# Patient Record
Sex: Male | Born: 1935 | Race: White | Hispanic: No | Marital: Married | Smoking: Former smoker
Health system: Southern US, Community
[De-identification: ages and names within clinical notes are randomized; demographics above are authoritative.]

## PROBLEM LIST (undated history)

## (undated) DIAGNOSIS — R159 Full incontinence of feces: Secondary | ICD-10-CM

## (undated) DIAGNOSIS — C4440 Unspecified malignant neoplasm of skin of scalp and neck: Secondary | ICD-10-CM

## (undated) DIAGNOSIS — M1990 Unspecified osteoarthritis, unspecified site: Secondary | ICD-10-CM

## (undated) DIAGNOSIS — Z8672 Personal history of thrombophlebitis: Secondary | ICD-10-CM

## (undated) DIAGNOSIS — F419 Anxiety disorder, unspecified: Secondary | ICD-10-CM

## (undated) DIAGNOSIS — G4730 Sleep apnea, unspecified: Secondary | ICD-10-CM

## (undated) DIAGNOSIS — I10 Essential (primary) hypertension: Secondary | ICD-10-CM

## (undated) DIAGNOSIS — F329 Major depressive disorder, single episode, unspecified: Secondary | ICD-10-CM

## (undated) DIAGNOSIS — F32A Depression, unspecified: Secondary | ICD-10-CM

## (undated) DIAGNOSIS — E785 Hyperlipidemia, unspecified: Secondary | ICD-10-CM

## (undated) DIAGNOSIS — C801 Malignant (primary) neoplasm, unspecified: Secondary | ICD-10-CM

## (undated) DIAGNOSIS — K219 Gastro-esophageal reflux disease without esophagitis: Secondary | ICD-10-CM

## (undated) DIAGNOSIS — E119 Type 2 diabetes mellitus without complications: Secondary | ICD-10-CM

## (undated) DIAGNOSIS — Z923 Personal history of irradiation: Secondary | ICD-10-CM

## (undated) DIAGNOSIS — N2889 Other specified disorders of kidney and ureter: Secondary | ICD-10-CM

## (undated) DIAGNOSIS — Z889 Allergy status to unspecified drugs, medicaments and biological substances status: Secondary | ICD-10-CM

## (undated) DIAGNOSIS — R350 Frequency of micturition: Secondary | ICD-10-CM

## (undated) DIAGNOSIS — Z9189 Other specified personal risk factors, not elsewhere classified: Secondary | ICD-10-CM

## (undated) DIAGNOSIS — C4490 Unspecified malignant neoplasm of skin, unspecified: Secondary | ICD-10-CM

## (undated) DIAGNOSIS — I6529 Occlusion and stenosis of unspecified carotid artery: Secondary | ICD-10-CM

## (undated) DIAGNOSIS — F0390 Unspecified dementia without behavioral disturbance: Secondary | ICD-10-CM

## (undated) HISTORY — PX: COLON SURGERY: SHX602

## (undated) HISTORY — DX: Malignant (primary) neoplasm, unspecified: C80.1

## (undated) HISTORY — DX: Hyperlipidemia, unspecified: E78.5

## (undated) HISTORY — PX: PROSTATECTOMY: SHX69

## (undated) HISTORY — PX: LUMBAR LAMINECTOMY: SHX95

## (undated) HISTORY — DX: Anxiety disorder, unspecified: F41.9

## (undated) HISTORY — DX: Type 2 diabetes mellitus without complications: E11.9

## (undated) HISTORY — PX: APPENDECTOMY: SHX54

## (undated) HISTORY — DX: Occlusion and stenosis of unspecified carotid artery: I65.29

## (undated) HISTORY — PX: OTHER SURGICAL HISTORY: SHX169

## (undated) HISTORY — PX: HERNIA REPAIR: SHX51

## (undated) HISTORY — PX: SKIN CANCER EXCISION: SHX779

## (undated) HISTORY — DX: Unspecified dementia without behavioral disturbance: F03.90

## (undated) HISTORY — DX: Essential (primary) hypertension: I10

## (undated) HISTORY — PX: CATARACT EXTRACTION: SUR2

## (undated) HISTORY — PX: TONSILLECTOMY: SUR1361

---

## 1998-07-25 ENCOUNTER — Encounter: Payer: Self-pay | Admitting: Oncology

## 1998-07-25 ENCOUNTER — Ambulatory Visit (HOSPITAL_COMMUNITY): Admission: RE | Admit: 1998-07-25 | Discharge: 1998-07-25 | Payer: Self-pay | Admitting: Oncology

## 1999-10-09 ENCOUNTER — Ambulatory Visit (HOSPITAL_COMMUNITY): Admission: RE | Admit: 1999-10-09 | Discharge: 1999-10-09 | Payer: Self-pay | Admitting: Oncology

## 1999-10-09 ENCOUNTER — Encounter: Payer: Self-pay | Admitting: Oncology

## 2000-01-02 ENCOUNTER — Ambulatory Visit (HOSPITAL_COMMUNITY): Admission: RE | Admit: 2000-01-02 | Discharge: 2000-01-02 | Payer: Self-pay | Admitting: Gastroenterology

## 2000-01-02 ENCOUNTER — Encounter (INDEPENDENT_AMBULATORY_CARE_PROVIDER_SITE_OTHER): Payer: Self-pay | Admitting: Specialist

## 2000-10-20 ENCOUNTER — Ambulatory Visit (HOSPITAL_COMMUNITY): Admission: RE | Admit: 2000-10-20 | Discharge: 2000-10-20 | Payer: Self-pay | Admitting: Gastroenterology

## 2000-10-20 ENCOUNTER — Encounter: Payer: Self-pay | Admitting: Gastroenterology

## 2003-01-24 ENCOUNTER — Inpatient Hospital Stay (HOSPITAL_COMMUNITY): Admission: AD | Admit: 2003-01-24 | Discharge: 2003-01-27 | Payer: Self-pay | Admitting: Endocrinology

## 2003-05-22 ENCOUNTER — Encounter: Payer: Self-pay | Admitting: Endocrinology

## 2003-05-22 ENCOUNTER — Ambulatory Visit (HOSPITAL_COMMUNITY): Admission: RE | Admit: 2003-05-22 | Discharge: 2003-05-22 | Payer: Self-pay | Admitting: Endocrinology

## 2004-05-22 ENCOUNTER — Ambulatory Visit (HOSPITAL_COMMUNITY): Admission: RE | Admit: 2004-05-22 | Discharge: 2004-05-22 | Payer: Self-pay | Admitting: Urology

## 2004-06-19 ENCOUNTER — Ambulatory Visit: Admission: RE | Admit: 2004-06-19 | Discharge: 2004-07-05 | Payer: Self-pay | Admitting: Radiation Oncology

## 2004-08-21 ENCOUNTER — Ambulatory Visit: Admission: RE | Admit: 2004-08-21 | Discharge: 2004-11-12 | Payer: Self-pay | Admitting: Radiation Oncology

## 2004-11-29 ENCOUNTER — Ambulatory Visit: Admission: RE | Admit: 2004-11-29 | Discharge: 2004-11-29 | Payer: Self-pay | Admitting: Radiation Oncology

## 2005-02-04 ENCOUNTER — Encounter: Admission: RE | Admit: 2005-02-04 | Discharge: 2005-02-04 | Payer: Self-pay | Admitting: Internal Medicine

## 2005-08-27 ENCOUNTER — Ambulatory Visit (HOSPITAL_COMMUNITY): Admission: RE | Admit: 2005-08-27 | Discharge: 2005-08-27 | Payer: Self-pay | Admitting: Internal Medicine

## 2006-11-18 ENCOUNTER — Encounter: Admission: RE | Admit: 2006-11-18 | Discharge: 2006-11-18 | Payer: Self-pay | Admitting: Neurosurgery

## 2007-04-30 ENCOUNTER — Ambulatory Visit: Payer: Self-pay | Admitting: *Deleted

## 2007-05-19 ENCOUNTER — Ambulatory Visit: Payer: Self-pay | Admitting: Vascular Surgery

## 2008-05-03 ENCOUNTER — Ambulatory Visit: Payer: Self-pay | Admitting: Vascular Surgery

## 2008-06-21 ENCOUNTER — Encounter: Admission: RE | Admit: 2008-06-21 | Discharge: 2008-06-21 | Payer: Self-pay | Admitting: Internal Medicine

## 2008-10-26 ENCOUNTER — Emergency Department (HOSPITAL_COMMUNITY): Admission: EM | Admit: 2008-10-26 | Discharge: 2008-10-26 | Payer: Self-pay | Admitting: Emergency Medicine

## 2008-11-08 ENCOUNTER — Ambulatory Visit: Payer: Self-pay | Admitting: Vascular Surgery

## 2009-05-08 ENCOUNTER — Ambulatory Visit: Payer: Self-pay | Admitting: Vascular Surgery

## 2009-11-07 ENCOUNTER — Ambulatory Visit: Payer: Self-pay | Admitting: Vascular Surgery

## 2010-07-10 ENCOUNTER — Ambulatory Visit: Payer: Self-pay | Admitting: Vascular Surgery

## 2011-02-05 NOTE — Procedures (Signed)
CAROTID DUPLEX EXAM   INDICATION:  Followup carotid artery disease.   HISTORY:  Diabetes:  no  Cardiac:  no  Hypertension:  yes  Smoking:  Previous  Previous Surgery:  no  CV History:  Asymptomatic  Amaurosis Fugax No, Paresthesias No, Hemiparesis No                                       RIGHT             LEFT  Brachial systolic pressure:         130               128  Brachial Doppler waveforms:         wnl               wnl  Vertebral direction of flow:        Antegrade         Antegrade  DUPLEX VELOCITIES (cm/sec)  CCA peak systolic                   116               98  ECA peak systolic                   150               136  ICA peak systolic                   262               106  ICA end diastolic                   76                35  PLAQUE MORPHOLOGY:                  heterogenous      heterogenous  PLAQUE AMOUNT:                      Moderate/ severe  Mild  PLAQUE LOCATION:                    ICA / ECA / bifurcation             ICA / ECA / bifurcation   IMPRESSION:  1. Right internal carotid artery shows evidence of 60% to 79%      stenosis.  2. Left internal carotid artery shows evidence of 20% to 39% stenosis.  3. No significant changes from previous study.   ___________________________________________  Quita Skye Hart [place], M.D.   AS/MEDQ  D:  11/07/2009  T:  11/07/2009  Job:  [id number]

## 2011-02-05 NOTE — Procedures (Signed)
CAROTID DUPLEX EXAM   INDICATION:  Follow up carotid artery disease.   HISTORY:  Diabetes:  No.  Cardiac:  No.  Hypertension:  Yes.  Smoking:  Previous.  Previous Surgery:  No.  CV History:  Asymptomatic.  Amaurosis Fugax , Paresthesias , Hemiparesis                                       RIGHT             LEFT  Brachial systolic pressure:         127               133  Brachial Doppler waveforms:         Within normal limits                Within normal limits  Vertebral direction of flow:        Antegrade         Antegrade  DUPLEX VELOCITIES (cm/sec)  CCA peak systolic                   94                74  ECA peak systolic                   109               134  ICA peak systolic                   227               83  ICA end diastolic                   65                32  PLAQUE MORPHOLOGY:                  Heterogenous      Heterogenous  PLAQUE AMOUNT:                      Moderate          Mild  PLAQUE LOCATION:                    ICA               ICA, bifurcation,  ECA   IMPRESSION:  1. Right internal carotid artery velocities suggest 60% to 79%      stenosis.  2. Left internal carotid artery velocities suggest 1% to 39% stenosis.  3. No significant changes from previous study.   ___________________________________________  Melvin Kent [place], M.D.   EM/MEDQ  D:  07/10/2010  T:  07/10/2010  Job:  [id number]

## 2011-02-05 NOTE — Procedures (Signed)
CAROTID DUPLEX EXAM   INDICATION:  Followup evaluation of known carotid artery disease.   HISTORY:  Diabetes:  No.  Cardiac:  No.  Hypertension:  Yes.  Smoking:  One and a half packs per day for 35 years, but does not  currently smoke.  Previous Surgery:  No.  CV History:  Patient reports no cerebrovascular symptoms at this time.  Amaurosis Fugax no, Paresthesias no, Hemiparesis no.                                       RIGHT             LEFT  Brachial systolic pressure:         146               146  Brachial Doppler waveforms:         Triphasic         Triphasic  Vertebral direction of flow:        Antegrade         Antegrade  DUPLEX VELOCITIES (cm/sec)  CCA peak systolic                   99                79  ECA peak systolic                   89                120  ICA peak systolic                   237               76  ICA end diastolic                   68                24  PLAQUE MORPHOLOGY:                  Calcified         Mixed  PLAQUE AMOUNT:                      Moderate-to-severe                  Mild  PLAQUE LOCATION:                    Proximal ICA      Proximal ICA   IMPRESSION:  60-79% right internal carotid artery stenosis.   20-39% left internal carotid artery stenosis.   No significant change from previous study performed Feb 11, 2006.   ___________________________________________  Quita Skye. Hart [place], M.D.   MC/MEDQ  D:  05/19/2007  T:  05/20/2007  Job:  [id number]

## 2011-02-05 NOTE — Procedures (Signed)
CAROTID DUPLEX EXAM   INDICATION:  Follow-up of known carotid artery disease.   HISTORY:  Diabetes:  No.  Cardiac:  No.  Hypertension:  Yes.  Smoking:  Quit.  Previous Surgery:  No.  CV History:  No.  Amaurosis Fugax No, Paresthesias No, Hemiparesis No.                                       RIGHT             LEFT  Brachial systolic pressure:         124               130  Brachial Doppler waveforms:         Biphasic          Biphasic  Vertebral direction of flow:        Antegrade         Antegrade  DUPLEX VELOCITIES (cm/sec)  CCA peak systolic                   140               81  ECA peak systolic                   156               119  ICA peak systolic                   235               108 distal  ICA end diastolic                   71                36  PLAQUE MORPHOLOGY:                  Heterogeneous     Heterogeneous  PLAQUE AMOUNT:                      Moderate to severe                  Mild  PLAQUE LOCATION:                    BIF/ICA/ECA       BIF/ICA   IMPRESSION:  1. A 60% to 79% stenosis of the right internal carotid artery.  2. A 20% to 39% stenosis of left internal carotid artery.    ___________________________________________  Quita Skye Hart [place], M.D.   AC/MEDQ  D:  11/08/2008  T:  11/08/2008  Job:  [id number]

## 2011-02-05 NOTE — Consult Note (Signed)
NEW PATIENT CONSULTATION   Melvin Kent, Melvin Kent  DOB:  08/25/1930                                       07/10/2010  CHART#:[id number]   The patient is a 77 -year-old male patient with known carotid occlusive  disease.  He denies any hemispheric or nonhemispheric TIAs, amaurosis  fugax, diplopia, blurred vision, syncope or previous stroke.  He has  been known in the past to have an approximately 60% right internal  carotid stenosis.   CHRONIC MEDICAL PROBLEMS:  1. Hypertension.  2. Hyperlipidemia.  3. Allergies including ragweed.  4. Negative for coronary artery disease, diabetes, COPD or stroke.   SOCIAL HISTORY:  He is married, has 3 children.  He is retired Cabin crew and  retired Airline pilot.  He does not use tobacco or alcohol.  He has  not smoked since 1975.   FAMILY HISTORY:  Positive for coronary artery disease in his brother and  multiple siblings.  Negative for diabetes and stroke.   REVIEW OF SYSTEMS:  Denies any chest pain, dyspnea on exertion, PND,  orthopnea, claudication, no productive cough, bronchitis.  There is  wheeze only when around ragweed.  All other systems in review of systems  are negative.   PHYSICAL EXAMINATION:  Vital signs:  Blood pressure 128/78, heart rate  60, respirations 14.  General:  Well-developed, well-nourished male in  no apparent stress, alert and oriented x 3.  HEENT:  Exam normal.  EOMs  intact.  Lungs:  Clear to auscultation.  No rhonchi or wheezing.  Cardiovascular:  Regular rhythm.  No murmurs.  Carotid pulses 3+, no  bruits.  Abdomen:  Obese.  No palpable masses.  Musculoskeletal:  Exam  is free of major deformities.  Neurologic:  Exam is normal.  SKIN:  Free  of rashes.  Lower extremity exam:  Reveals 3+ femoral, popliteal and  dorsalis pedis pulses palpable.   Today I ordered a carotid duplex exam which I reviewed and interpreted.  He has a 60% right internal carotid stenosis and no flow reduction on  the left  side.  I reassured him regarding these findings.  If he  develops any symptoms, he will be in touch with [place].  Otherwise we will  have him return in 1 year for follow-up carotid duplex exam.     Quita Skye. Hart [place], M.D.  Electronically Signed   JDL/MEDQ  D:  07/10/2010  T:  07/11/2010  Job:  [id number]   cc:   Dr. Jola Schmidt

## 2011-02-05 NOTE — Procedures (Signed)
DUPLEX DEEP VENOUS EXAM - LOWER EXTREMITY   INDICATION:  Left lower extremity swelling, about one week duration.   HISTORY:  Edema:  Yes.  Trauma/Surgery:  No.  Pain:  No.  PE:  No.  Previous DVT:  No.  Anticoagulants:  No.  Other:   DUPLEX EXAM:                CFV   SFV   PopV  PTV    GSV                R  L  R  L  R  L  R   L  R  L  Thrombosis    o  o     o     o      o     o  Spontaneous   +  +     +     +      +     +  Phasic        +  +     +     +      +     +  Augmentation  +  +     +     +      +     +  Compressible  +  +     +     +      +     +  Competent     +  +     +     +      +     +   Legend:  + - yes  o - no  p - partial  D - decreased   IMPRESSION:  The left lower extremity deep venous system was imaged,  Dopplered, and appears patent, showing no evidence of a DVT.   The contralateral common femoral vein also appears patent with no  evidence of DVT.    _____________________________  Quita Skye Hart [place], M.D.   AS/MEDQ  D:  04/30/2007  T:  04/30/2007  Job:  [id number]

## 2011-02-05 NOTE — Procedures (Signed)
CAROTID DUPLEX EXAM   INDICATION:  Follow up carotid artery disease.   HISTORY:  Diabetes:  No.  Cardiac:  No.  Hypertension:  Yes.  Smoking:  Previous.  Previous Surgery:  No.  CV History:  No.  Amaurosis Fugax No, Paresthesias No, Hemiparesis No.                                       RIGHT               LEFT  Brachial systolic pressure:         154                 150  Brachial Doppler waveforms:         WNL                 WNL  Vertebral direction of flow:        Antegrade           Antegrade  DUPLEX VELOCITIES (cm/sec)  CCA peak systolic                   99                  96  ECA peak systolic                   156                 126  ICA peak systolic                   248                 93  ICA end diastolic                   78                  35  PLAQUE MORPHOLOGY:                  Heterogeneous       Heterogeneous  PLAQUE AMOUNT:                      Moderate/severe     Mild  PLAQUE LOCATION:                    ICA/ECA/bifurcation  ICA/ECA/bifurcation    IMPRESSION:  1. Right ICA shows evidence of 60% to 79% stenosis.  2. Left ICA shows evidence of 20% to 39% stenosis.  3. No significant changes from previous study.   ___________________________________________  Quita Skye Hart [place], M.D.   AS/MEDQ  D:  05/08/2009  T:  05/08/2009  Job:  [id number]   cc:   Kari Baars, M.D.

## 2011-02-05 NOTE — Procedures (Signed)
CAROTID DUPLEX EXAM   INDICATION:  Followup, known carotid artery disease.   HISTORY:  Diabetes:  No.  Cardiac:  No.  Hypertension:  Yes.  Smoking:  Yes.  Previous Surgery:  CV History:  Amaurosis Fugax No, Paresthesias No, Hemiparesis No.                                       RIGHT             LEFT  Brachial systolic pressure:         140               140  Brachial Doppler waveforms:         Biphasic          Biphasic  Vertebral direction of flow:        Antegrade         Antegrade  DUPLEX VELOCITIES (cm/sec)  CCA peak systolic                   87                66  ECA peak systolic                   127               104  ICA peak systolic                   277               54  ICA end diastolic                   65                20  PLAQUE MORPHOLOGY:                  Heterogenous      Mixed  PLAQUE AMOUNT:                      Moderate-to-severe                  Mild  PLAQUE LOCATION:                    ICA, ECA          ICA, ECA   IMPRESSION:  1. 60-79% stenosis noted in the right internal carotid artery.  2. 20-39% stenosis noted in the left internal carotid artery.  3. Antegrade bilateral vertebral arteries.   ___________________________________________  Quita Skye Hart [place], M.D.   MG/MEDQ  D:  05/03/2008  T:  05/03/2008  Job:  [id number]

## 2011-02-08 NOTE — Discharge Summary (Signed)
NAMEABIJAH, ROUSSEL                             ACCOUNT NO.:  [id number]   MEDICAL RECORD NO.:  [id number]                   PATIENT TYPE:  INP   LOCATION:  5743                                 FACILITY:  [facility]   PHYSICIAN:  [name], M.D.             DATE OF BIRTH:  [birth year]   DATE OF ADMISSION:  01/24/2003  DATE OF DISCHARGE:                                 DISCHARGE SUMMARY   HISTORY:  This is a 77 year old man, who presents with a history of recent  onset of shaking chills and fever.  His symptoms began approximately 24  hours prior to this admission and were rather acute in onset.  He was  evaluated in the office and found to have a white count of 16,000.  He had a  chest x-ray which was negative.  He continued to have severe symptoms  through the night prior to this admission with severe episodes of shaking  chills.  However, he had no other specific symptoms.   PHYSICAL EXAMINATION:  GENERAL:  Well-developed man who appeared in moderate  distress.  His skin was pale.  He appeared acutely ill.  LUNGS:  Clear.  CARDIOVASCULAR:  Regular.  ABDOMEN:  Soft.  RECTAL:  Prostate mildly tender.   IMPRESSION ON ADMISSION:  1. Urosepsis (prostatitis and septicemia).  2. History of hypertension.   HOSPITAL COURSE:  He was admitted to the hospital.  Blood cultures and urine  cultures were done.  The blood cultures were negative.  The urine culture  was 6000 colonies of insignificant growth.  He did have a low potassium of  2.8 which was reversed by giving him oral potassium.  He was treated with  Cipro 400 mg IV and within 24 hours his temperature was much better.  His  white count decreased to 8000.  He felt significantly better.  He still has  an occasional temperature spike, and he was given IV Cipro.  This improved.  During the interim, he developed an area of redness on the right foot.  This  was right in the malleolar area.  It was nontender.  No masses were  present.  No evidence of skin penetration of any wound was present.  It was nontender.  This improved also.  He was then discharged.   IMPRESSION ON DISCHARGE:  1. Urosepsis (prostatitis).  2. History of hypertension.   MEDICATIONS ON DISCHARGE:  1. Cipro 500 mg b.i.d.  2. Maxzide 25, 1 daily.  3. Lopressor 50 mg daily.  4. Enteric-coated aspirin 81 mg daily.   DIET ON DISCHARGE:  As tolerated.    PLAN AND FOLLOW-UP:  He will be seen in the office in a period of one week.   CONDITION ON DISCHARGE:  Improved.  [name], M.D.    CGG/MEDQ  D:  01/27/2003  T:  01/28/2003  Job:  [id number]

## 2011-02-08 NOTE — H&P (Signed)
Melvin Kent, Melvin Kent                             ACCOUNT NO.:  [id number]   MEDICAL RECORD NO.:  [id number]                   PATIENT TYPE:  INP   LOCATION:  5727                                 FACILITY:  [facility]   PHYSICIAN:  [name], M.D.             DATE OF BIRTH:  [birth year]   DATE OF ADMISSION:  01/24/2003  DATE OF DISCHARGE:                                HISTORY & PHYSICAL   CHIEF COMPLAINT:  This is a 77 year old man who presents with a history of  fever and chills.   HISTORY OF PRESENT ILLNESS:  The patient has been in his usual health, but  possibly had some discomfort in his left groin area a week or two prior to  this admission.  At 9 o'clock the night prior to this admission, in the  evening, he developed symptoms of fever and chills with diffuse arthralgia.  No other symptoms are specific.  He has not had any cough.  He has had some  decreased appetite, but no vomiting.  No abdominal pain.  No dysuria.  He  has not noticed any problems with hematuria.  Essentially he has had no  other symptoms.  No upper respiratory symptoms either.  A white count in the  office was done and this was 16.4.  A chest x-ray was negative.   He has a history of hypertension and this has been controlled with  Lopressor.  No new medications have been added.   He has a history of carcinoma of the colon and this was resected in 1994,  and he had a small bowel obstruction in 1998.  He has had no recurrence and  this has been followed with colonoscopy.   PAST MEDICAL HISTORY:  He was hospitalized in 1985, for a ruptured disk.  In  1982, he had skin cancer.  In 1950, he had hernia surgery bilaterally.  In  1994, he had a colon resection for carcinoma of the colon and in 1998, a  small bowel obstruction.   MEDICATIONS:  1. Lopressor 50 mg, one daily.  2. Maxzide 25, one daily.  3. Enteric-coated aspirin 325 mg daily.   PERSONAL/SOCIAL HISTORY:  He does not smoke.  He stopped in  1984.  No  history of excessive alcohol intake.   ALLERGIES:  He is allergic to PENICILLIN which causes a rash and he has  trouble tolerating ZESTRIL which causes vomiting.   FAMILY HISTORY:  His mother died when the patient was [age] years old.  His  father died at the age of 26.  He has two brothers and one sister.  He is  married and has three children.  One child has had carcinoma of the colon.   REVIEW OF SYSTEMS:  His weight has been stable.  CARDIOVASCULAR/RESPIRATORY:  No complaints.  GASTROINTESTINAL:  See above.  GENITOURINARY:  See above.   PHYSICAL EXAMINATION:  VITAL  SIGNS:  Blood pressure is 124/80.  His  temperature is 102.  HEENT:  Head is normocephalic.  Eyes equal respond to light.  NECK:  Neck is supple.  No adenopathy is present.  LUNGS:  Lungs are clear.  CARDIOVASCULAR:  Rhythm is regular.  ABDOMEN:  Abdomen is soft.  No masses are present.  No tenderness is  present.  BACK:  No CVA tenderness is present.  RECTAL:  Examination revealed mild tenderness in the right lobe of the  prostate.  EXTREMITIES:  Normal.  No edema is present.  NEUROMUSCULAR:  Normal.   IMPRESSION:  1. Probable prostatitis.  2. Sepsis secondary to #1.  3. History of carcinoma of the colon.  4. History of hypertension.                                               [name], M.D.    CGG/MEDQ  D:  01/24/2003  T:  01/24/2003  Job:  [id number]

## 2011-07-09 ENCOUNTER — Other Ambulatory Visit (INDEPENDENT_AMBULATORY_CARE_PROVIDER_SITE_OTHER): Payer: Medicare Other | Admitting: *Deleted

## 2011-07-09 DIAGNOSIS — I6529 Occlusion and stenosis of unspecified carotid artery: Secondary | ICD-10-CM

## 2011-07-16 ENCOUNTER — Encounter: Payer: Self-pay | Admitting: Vascular Surgery

## 2011-07-16 NOTE — Procedures (Unsigned)
CAROTID DUPLEX EXAM  INDICATION:  Carotid stenosis.  HISTORY: Diabetes:  No. Cardiac:  No. Hypertension:  Yes. Smoking:  Previous. Previous Surgery:  No. CV History:  Currently asymptomatic. Amaurosis Fugax No, Paresthesias No, Hemiparesis No                                      RIGHT             LEFT Brachial systolic pressure:         130               130 Brachial Doppler waveforms:         Normal            Normal Vertebral direction of flow:        Antegrade         Antegrade DUPLEX VELOCITIES (cm/sec) CCA peak systolic                   64                68 ECA peak systolic                   140               108 ICA peak systolic                   234               70 ICA end diastolic                   65                28 PLAQUE MORPHOLOGY:                  Calcific          Mixed PLAQUE AMOUNT:                      Moderate          Minimal PLAQUE LOCATION:                    ICA               ICA, bifurcation, ECA  IMPRESSION: 1. Right internal carotid artery velocity suggests 60%-79% stenosis. 2. Left internal carotid artery velocity suggests 1%-39% stenosis. 3. Stable in comparison to the previous exam.  ___________________________________________ Quita Skye. Hart [place], M.D.  EM/MEDQ  D:  07/09/2011  T:  07/09/2011  Job:  [id number]

## 2011-07-17 ENCOUNTER — Other Ambulatory Visit: Payer: Self-pay

## 2011-07-17 DIAGNOSIS — I6529 Occlusion and stenosis of unspecified carotid artery: Secondary | ICD-10-CM

## 2012-01-07 ENCOUNTER — Encounter: Payer: Self-pay | Admitting: Vascular Surgery

## 2012-01-15 ENCOUNTER — Encounter: Payer: Self-pay | Admitting: Neurosurgery

## 2012-01-16 ENCOUNTER — Other Ambulatory Visit (INDEPENDENT_AMBULATORY_CARE_PROVIDER_SITE_OTHER): Payer: Medicare Other | Admitting: *Deleted

## 2012-01-16 ENCOUNTER — Ambulatory Visit (INDEPENDENT_AMBULATORY_CARE_PROVIDER_SITE_OTHER): Payer: Medicare Other | Admitting: Neurosurgery

## 2012-01-16 ENCOUNTER — Encounter: Payer: Self-pay | Admitting: Neurosurgery

## 2012-01-16 DIAGNOSIS — I6529 Occlusion and stenosis of unspecified carotid artery: Secondary | ICD-10-CM | POA: Insufficient documentation

## 2012-01-16 NOTE — Progress Notes (Signed)
[facility] HISTORY AND PHYSICAL   CC: Annual carotid duplex Referring Physician: Hart Rochester  History of Present Illness: 78 year old male patient of Dr. Hart Rochester seen for followup of carotid stenosis. Patient reports no signs of CVA, TIA, diplopia, dysphagia, amaurosis fugax, or word finding difficulties. Patient reports no new medical problems and no new surgery since the last appointment here.  Past Medical History  Diagnosis Date  . Hyperlipidemia   . Hypertension   . Carotid artery occlusion     ROS: [x]  Positive   [ ]  Denies    General: [ ]  Weight loss, [ ]  Fever, [ ]  chills Neurologic: [ ]  Dizziness, [ ]  Blackouts, [ ]  Seizure [ ]  Stroke, [ ]  "Mini stroke", [ ]  Slurred speech, [ ]  Temporary blindness; [ ]  weakness in arms or legs, [ ]  Hoarseness Cardiac: [ ]  Chest pain/pressure, [ ]  Shortness of breath at rest [ ]  Shortness of breath with exertion, [ ]  Atrial fibrillation or irregular heartbeat Vascular: [ ]  Pain in legs with walking, [ ]  Pain in legs at rest, [ ]  Pain in legs at night,  [ ]  Non-healing ulcer, [ ]  Blood clot in vein/DVT,   Pulmonary: [ ]  Home oxygen, [ ]  Productive cough, [ ]  Coughing up blood, [ ]  Asthma,  [ ]  Wheezing Musculoskeletal:  [ ]  Arthritis, [ ]  Low back pain, [ ]  Joint pain Hematologic: [ ]  Easy Bruising, [ ]  Anemia; [ ]  Hepatitis Gastrointestinal: [ ]  Blood in stool, [ ]  Gastroesophageal Reflux/heartburn, [ ]  Trouble swallowing Urinary: [ ]  chronic Kidney disease, [ ]  on HD - [ ]  MWF or [ ]  TTHS, [ ]  Burning with urination, [ ]  Difficulty urinating Skin: [ ]  Rashes, [ ]  Wounds Psychological: [ ]  Anxiety, [ ]  Depression   Social History History  Substance Use Topics  . Smoking status: Former Smoker    Types: Cigarettes    Quit date: 09/23/1973  . Smokeless tobacco: Not on file  . Alcohol Use: No    Family History Family History  Problem Relation Age of Onset  . Coronary artery disease Sister   . Coronary artery  disease Brother   . Coronary artery disease Brother     Allergies  Allergen Reactions  . Penicillins     Current Outpatient Prescriptions  Medication Sig Dispense Refill  . aspirin 81 MG tablet Take 81 mg by mouth daily.      Marland Kitchen atorvastatin (LIPITOR) 20 MG tablet Take 20 mg by mouth daily.      Marland Kitchen escitalopram (LEXAPRO) 20 MG tablet Take 20 mg by mouth daily.      . fish oil-omega-3 fatty acids 1000 MG capsule Take 1 g by mouth daily.      . Multiple Vitamin (MULTIVITAMIN) tablet Take 1 tablet by mouth daily.      Marland Kitchen olmesartan (BENICAR) 5 MG tablet Take 5 mg by mouth daily.      . vitamin B-12 (CYANOCOBALAMIN) 1000 MCG tablet Take 1,000 mcg by mouth daily.        Physical Examination  There were no vitals filed for this visit.  There is no height or weight on file to calculate BMI.  General:  WDWN in NAD Gait: Normal HEENT: WNL Eyes: Pupils equal Pulmonary: normal non-labored breathing , without Rales, rhonchi,  wheezing Cardiac: RRR, without  Murmurs, rubs or gallops; Abdomen: soft, NT, no masses Skin: no rashes, ulcers noted  Vascular Exam Pulses: Patient has 2+ radial pulses bilaterally  Carotid bruits: There is a very mild carotid bruit on the right, the left carotid has a audible pulse Extremities without ischemic changes, no Gangrene , no cellulitis; no open wounds;  Musculoskeletal: no muscle wasting or atrophy   Neurologic: A&O X 3; Appropriate Affect ; SENSATION: normal; MOTOR FUNCTION:  moving all extremities equally. Speech is fluent/normal  Non-Invasive Vascular Imaging CAROTID DUPLEX 01/16/2012  Right ICA 40 - 59 % stenosis Left ICA 0 - 19% stenosis   ASSESSMENT/PLAN: Assessment as above, plan the patient will followup in one year for repeat carotid duplex and be seen in my clinic, he and his wife's questions were encouraged and answered.  Lauree Chandler ANP   Clinic MD: Darrick Penna

## 2012-01-22 NOTE — Procedures (Unsigned)
CAROTID DUPLEX EXAM  INDICATION:  Carotid stenosis.  HISTORY: Diabetes:  No. Cardiac:  No. Hypertension:  Yes. Smoking:  Previous. Previous Surgery:  No. CV History:  Currently asymptomatic. Amaurosis Fugax No, Paresthesias No, Hemiparesis No                                      RIGHT             LEFT Brachial systolic pressure:         130               129 Brachial Doppler waveforms:         Normal            Normal Vertebral direction of flow:        Antegrade         Antegrade DUPLEX VELOCITIES (cm/sec) CCA peak systolic                   94                72 ECA peak systolic                   98                96 ICA peak systolic                   182               56 ICA end diastolic                   61                21 PLAQUE MORPHOLOGY:                  Calcific          Mixed PLAQUE AMOUNT:                      Moderate          Minimal PLAQUE LOCATION:                    ICA, bifurcation  ICA, bifurcation, ECA  IMPRESSION: 1. Right internal carotid artery velocity suggests 40-59% stenosis     which may be underestimated due to calcific plaque with acoustic     shadowing. 2. Left internal carotid artery velocity suggests 1-39% stenosis. 3. Antegrade vertebral arteries bilaterally. 4. Stable in comparison to the previous exam.  ___________________________________________ Melvin Kent. Hart Rochester, M.D.  EM/MEDQ  D:  01/17/2012  T:  01/17/2012  Job:  [id number]

## 2012-04-28 ENCOUNTER — Ambulatory Visit (HOSPITAL_COMMUNITY)
Admission: RE | Admit: 2012-04-28 | Discharge: 2012-04-28 | Disposition: A | Discharge status: Home / Self Care | Payer: Medicare Other | Source: Ambulatory Visit | Attending: Orthopaedic Surgery | Admitting: Orthopaedic Surgery

## 2012-04-28 DIAGNOSIS — R609 Edema, unspecified: Secondary | ICD-10-CM

## 2012-04-28 NOTE — Progress Notes (Signed)
Right:  No evidence of DVT, superficial thrombosis, or Baker's cyst.  Negative for DVT in the left common femoral vein.   

## 2012-11-24 ENCOUNTER — Other Ambulatory Visit: Payer: Self-pay | Admitting: Urology

## 2012-11-26 ENCOUNTER — Encounter (HOSPITAL_COMMUNITY): Payer: Self-pay | Admitting: Pharmacy Technician

## 2012-12-01 ENCOUNTER — Other Ambulatory Visit: Payer: Self-pay

## 2012-12-01 ENCOUNTER — Ambulatory Visit (HOSPITAL_COMMUNITY)
Admission: RE | Admit: 2012-12-01 | Discharge: 2012-12-01 | Disposition: A | Discharge status: Home / Self Care | Payer: Medicare Other | Source: Ambulatory Visit | Attending: Urology | Admitting: Urology

## 2012-12-01 ENCOUNTER — Encounter (HOSPITAL_COMMUNITY): Payer: Self-pay

## 2012-12-01 ENCOUNTER — Encounter (HOSPITAL_COMMUNITY)
Admission: RE | Admit: 2012-12-01 | Discharge: 2012-12-01 | Disposition: A | Discharge status: Home / Self Care | Payer: Medicare Other | Source: Ambulatory Visit | Attending: Urology | Admitting: Urology

## 2012-12-01 DIAGNOSIS — Z01810 Encounter for preprocedural cardiovascular examination: Secondary | ICD-10-CM | POA: Insufficient documentation

## 2012-12-01 DIAGNOSIS — I10 Essential (primary) hypertension: Secondary | ICD-10-CM | POA: Insufficient documentation

## 2012-12-01 DIAGNOSIS — Z01812 Encounter for preprocedural laboratory examination: Secondary | ICD-10-CM | POA: Insufficient documentation

## 2012-12-01 DIAGNOSIS — N2889 Other specified disorders of kidney and ureter: Secondary | ICD-10-CM | POA: Insufficient documentation

## 2012-12-01 DIAGNOSIS — J438 Other emphysema: Secondary | ICD-10-CM | POA: Insufficient documentation

## 2012-12-01 HISTORY — DX: Unspecified malignant neoplasm of skin, unspecified: C44.90

## 2012-12-01 HISTORY — DX: Frequency of micturition: R35.0

## 2012-12-01 HISTORY — DX: Depression, unspecified: F32.A

## 2012-12-01 HISTORY — DX: Full incontinence of feces: R15.9

## 2012-12-01 HISTORY — DX: Gastro-esophageal reflux disease without esophagitis: K21.9

## 2012-12-01 HISTORY — DX: Unspecified osteoarthritis, unspecified site: M19.90

## 2012-12-01 HISTORY — DX: Major depressive disorder, single episode, unspecified: F32.9

## 2012-12-01 HISTORY — DX: Personal history of thrombophlebitis: Z86.72

## 2012-12-01 HISTORY — DX: Other specified disorders of kidney and ureter: N28.89

## 2012-12-01 LAB — BASIC METABOLIC PANEL
Calcium: 9 mg/dL (ref 8.4–10.5)
GFR calc non Af Amer: 61 mL/min — ABNORMAL LOW (ref >90)
Sodium: 136 mEq/L (ref 135–145)

## 2012-12-01 LAB — CBC
HCT: 33.7 % — ABNORMAL LOW (ref 39.0–52.0)
MCH: 25.6 pg — ABNORMAL LOW (ref 26.0–34.0)
MCHC: 32.6 g/dL (ref 30.0–36.0)
RBC: 4.3 MIL/uL (ref 4.22–5.81)
RDW: 16.4 % — ABNORMAL HIGH (ref 11.5–15.5)
WBC: 11.6 10*3/uL — ABNORMAL HIGH (ref 4.0–10.5)

## 2012-12-01 LAB — SURGICAL PCR SCREEN
MRSA, PCR: POSITIVE — AB
Staphylococcus aureus: POSITIVE — AB

## 2012-12-01 NOTE — Patient Instructions (Signed)
Lorence Nagengast  12/01/2012                           YOUR PROCEDURE IS SCHEDULED ON: 12/03/12               PLEASE REPORT TO SHORT STAY CENTER AT :6:45 am               CALL THIS NUMBER IF ANY PROBLEMS THE DAY OF SURGERY :               [phone]                      REMEMBER:   Do not eat food or drink liquids AFTER MIDNIGHT     Take these medicines the morning of surgery with A SIP OF WATER:  Allegra / Venlafaxine   Do not wear jewelry, make-up   Do not wear lotions, powders, or perfumes.   Do not shave legs or underarms 12 hrs. before surgery (men may shave face)  Do not bring valuables to the hospital.  Contacts, dentures or bridgework may not be worn into surgery.  Leave suitcase in the car. After surgery it may be brought to your room.  For patients admitted to the hospital more than one night, checkout time is 11:00                          The day of discharge.   Patients discharged the day of surgery will not be allowed to drive home                             If going home same day of surgery, must have someone stay with you first                           24 hrs at home and arrange for some one to drive you home from hospital.    Special Instructions:   Please read over the following fact sheets that you were given:               1. MRSA  INFORMATION                      2. [facility] PREPARING FOR SURGERY SHEET                                                X_____________________________________________________________________        Failure to follow these instructions may result in cancellation of your surgery

## 2012-12-01 NOTE — Progress Notes (Signed)
12/01/12 1457  OBSTRUCTIVE SLEEP APNEA  Have you ever been diagnosed with sleep apnea through a sleep study? No  Do you snore loudly (loud enough to be heard through closed doors)?  1  Do you often feel tired, fatigued, or sleepy during the daytime? 1  Has anyone observed you stop breathing during your sleep? 0  Do you have, or are you being treated for high blood pressure? 1  BMI more than 35 kg/m2? 0  Age over 77 years old? 1  Neck circumference greater than 40 cm/18 inches? 0  Gender: 1  Obstructive Sleep Apnea Score 5  Score 4 or greater  Results sent to PCP

## 2012-12-02 NOTE — H&P (Signed)
  History of Present Illness  Melvin Kent is an 79 year old with the following urologic history:  1) Prostate cancer: He was diagnosed in 2005 and underwent treatment with IMRT (76 Gy) and 2 years of Lupron. His PSA has been undetectable.  TNM stage: cT2c N0 M0 Gleason score: 4+5=9 Pretreatment PSA: 7.3  2) Erectile dysfunction: He has a history of a decreased libido and has previously been given sample for Viagra which he has not previously tried.    3) Osteopenia: He does have a history of osteopenia on prior bone density studies, the last of which was performed in 06/2006.  He has been on vitamin D and calcium supplementation and has been engaging in regular exercise at the [facility].  Interval history:  He developed microscopic hematuria recently and a CT scan of the abdomen and pelvis revealed a right ureteral mass.    Past Medical History Problems  1. History of  Arthritis V13.4 2. History of  Depression 311 3. History of  Diabetes Mellitus 250.00 4. History of  Hypercholesterolemia 272.0 5. History of  Hypertension 401.9 6. History of  Prostate Cancer V10.46 7. History of  Thrombophlebitis V12.52  Surgical History Problems  1. History of  Arthroscopy Knee 2. History of  Inguinal Hernia Repair 3. History of  Nose Surgery 4. History of  Surgery Of Male Genitalia Vasectomy V25.2  Current Meds 1. Allegra 180 MG TABS; Therapy: (Recorded:02May2008) to 2. Aspir-81 81 MG Oral Tablet Delayed Release; Therapy: (Recorded:02May2008) to 3. Benicar TABS; Therapy: (Recorded:19Nov2010) to 4. Caltrate 600 TABS; Therapy: (Recorded:02May2008) to 5. Cinnamon 500 MG Oral Capsule; Therapy: (Recorded:02May2008) to 6. Crestor TABS; Therapy: (Recorded:19May2010) to 7. FiberCon TABS; Therapy: (Recorded:02May2008) to 8. Fish Oil CAPS; Therapy: (Recorded:02May2008) to 9. Garlic CAPS; Therapy: (Recorded:02May2008) to 10. ICaps MV TABS; Therapy: (Recorded:02May2008) to 11. Multivitamins TABS;  Therapy: (Recorded:02May2008) to 12. Pamine Forte TABS; Therapy: (Recorded:02May2008) to  Allergies Medication  1. Penicillins  Family History Problems  1. Son's history of  Colon Cancer V16.0 2. Daughter's history of  Hypercholesterolemia 3. Daughter's history of  Hypertension V17.49  Social History Problems  1. Alcohol Use rarely 2. Marital History - Currently Married 3. Occupation: retired from [employer] and 23 years of service in the [employer]  4. Tobacco Use  Vitals  BMI Calculated: 30.46 BSA Calculated: 2.05 Height: 5 ft 8 in Weight: 201 lb    Physical Exam Constitutional: Well nourished and well developed . No acute distress.  Abdomen: The abdomen is soft and nontender. No masses are palpated. No CVA tenderness.  Rectal: The prostate is smooth and flat.      Discussion/Summary  1. Right ureteral mass: He will undergo cystoscopy, right retrograde pyelography, right ureteroscopy with possible biopsy, right ureteral stent. I discussed the potential benefits and risks of the procedure, side effects of the proposed treatment, the likelihood of the patient achieving the goals of the procedure, and any potential problems that might occur during the procedure or recuperation.

## 2012-12-02 NOTE — Progress Notes (Signed)
Left message for patient about getting Mupirocin ointment filled. No documentation noted he was informed. Requested call back for confirmation.

## 2012-12-03 ENCOUNTER — Encounter (HOSPITAL_COMMUNITY): Payer: Self-pay | Admitting: General Practice

## 2012-12-03 ENCOUNTER — Encounter (HOSPITAL_COMMUNITY)
Admission: RE | Disposition: A | Discharge status: Home / Self Care | Payer: Self-pay | Source: Ambulatory Visit | Attending: Urology

## 2012-12-03 ENCOUNTER — Ambulatory Visit (HOSPITAL_COMMUNITY): Payer: Medicare Other | Admitting: Registered Nurse

## 2012-12-03 ENCOUNTER — Encounter (HOSPITAL_COMMUNITY): Payer: Self-pay | Admitting: Registered Nurse

## 2012-12-03 ENCOUNTER — Ambulatory Visit (HOSPITAL_COMMUNITY)
Admission: RE | Admit: 2012-12-03 | Discharge: 2012-12-03 | Disposition: A | Discharge status: Home / Self Care | Payer: Medicare Other | Source: Ambulatory Visit | Attending: Urology | Admitting: Urology

## 2012-12-03 DIAGNOSIS — Z79899 Other long term (current) drug therapy: Secondary | ICD-10-CM | POA: Insufficient documentation

## 2012-12-03 DIAGNOSIS — N135 Crossing vessel and stricture of ureter without hydronephrosis: Secondary | ICD-10-CM | POA: Insufficient documentation

## 2012-12-03 DIAGNOSIS — I10 Essential (primary) hypertension: Secondary | ICD-10-CM | POA: Insufficient documentation

## 2012-12-03 DIAGNOSIS — E7800 Pure hypercholesterolemia, unspecified: Secondary | ICD-10-CM | POA: Insufficient documentation

## 2012-12-03 DIAGNOSIS — C669 Malignant neoplasm of unspecified ureter: Secondary | ICD-10-CM | POA: Insufficient documentation

## 2012-12-03 DIAGNOSIS — N529 Male erectile dysfunction, unspecified: Secondary | ICD-10-CM | POA: Insufficient documentation

## 2012-12-03 DIAGNOSIS — M949 Disorder of cartilage, unspecified: Secondary | ICD-10-CM | POA: Insufficient documentation

## 2012-12-03 DIAGNOSIS — C61 Malignant neoplasm of prostate: Secondary | ICD-10-CM | POA: Insufficient documentation

## 2012-12-03 DIAGNOSIS — M899 Disorder of bone, unspecified: Secondary | ICD-10-CM | POA: Insufficient documentation

## 2012-12-03 DIAGNOSIS — E119 Type 2 diabetes mellitus without complications: Secondary | ICD-10-CM | POA: Insufficient documentation

## 2012-12-03 HISTORY — PX: CYSTOSCOPY WITH RETROGRADE PYELOGRAM, URETEROSCOPY AND STENT PLACEMENT: SHX5789

## 2012-12-03 SURGERY — CYSTOURETEROSCOPY, WITH RETROGRADE PYELOGRAM AND STENT INSERTION
Anesthesia: General | Site: Ureter | Laterality: Right | Wound class: Clean Contaminated

## 2012-12-03 MED ORDER — PROPOFOL 10 MG/ML IV BOLUS
INTRAVENOUS | Status: DC | PRN
  Administered 2012-12-03: 150 mg via INTRAVENOUS

## 2012-12-03 MED ORDER — LIDOCAINE HCL (CARDIAC) 20 MG/ML IV SOLN
INTRAVENOUS | Status: DC | PRN
  Administered 2012-12-03: 80 mg via INTRAVENOUS

## 2012-12-03 MED ORDER — IOHEXOL 300 MG/ML  SOLN
INTRAMUSCULAR | Status: DC | PRN
  Administered 2012-12-03: 11 mL

## 2012-12-03 MED ORDER — FENTANYL CITRATE 0.05 MG/ML IJ SOLN
25.0000 ug | INTRAMUSCULAR | Status: DC | PRN
  Administered 2012-12-03: 25 ug via INTRAVENOUS

## 2012-12-03 MED ORDER — SODIUM CHLORIDE 0.9 % IR SOLN
Status: DC | PRN
  Administered 2012-12-03: 3000 mL via INTRAVESICAL

## 2012-12-03 MED ORDER — CIPROFLOXACIN IN D5W 400 MG/200ML IV SOLN
INTRAVENOUS | Status: AC
  Filled 2012-12-03: qty 200

## 2012-12-03 MED ORDER — ACETAMINOPHEN 10 MG/ML IV SOLN
INTRAVENOUS | Status: AC
  Filled 2012-12-03: qty 100

## 2012-12-03 MED ORDER — PHENYLEPHRINE HCL 10 MG/ML IJ SOLN
INTRAMUSCULAR | Status: DC | PRN
  Administered 2012-12-03: 40 ug via INTRAVENOUS
  Administered 2012-12-03: 80 ug via INTRAVENOUS

## 2012-12-03 MED ORDER — FENTANYL CITRATE 0.05 MG/ML IJ SOLN
INTRAMUSCULAR | Status: AC
  Filled 2012-12-03: qty 2

## 2012-12-03 MED ORDER — ACETAMINOPHEN 10 MG/ML IV SOLN
INTRAVENOUS | Status: DC | PRN
  Administered 2012-12-03: 1000 mg via INTRAVENOUS

## 2012-12-03 MED ORDER — CIPROFLOXACIN HCL 250 MG PO TABS: 250.0000 mg | ORAL_TABLET | Freq: Two times a day (BID) | ORAL | Status: DC

## 2012-12-03 MED ORDER — ONDANSETRON HCL 4 MG/2ML IJ SOLN
INTRAMUSCULAR | Status: DC | PRN
  Administered 2012-12-03: 4 mg via INTRAVENOUS

## 2012-12-03 MED ORDER — EPHEDRINE SULFATE 50 MG/ML IJ SOLN
INTRAMUSCULAR | Status: DC | PRN
  Administered 2012-12-03 (×5): 5 mg via INTRAVENOUS

## 2012-12-03 MED ORDER — FENTANYL CITRATE 0.05 MG/ML IJ SOLN
INTRAMUSCULAR | Status: DC | PRN
  Administered 2012-12-03 (×2): 25 ug via INTRAVENOUS

## 2012-12-03 MED ORDER — CIPROFLOXACIN IN D5W 400 MG/200ML IV SOLN
400.0000 mg | INTRAVENOUS | Status: AC
  Administered 2012-12-03: 400 mg via INTRAVENOUS

## 2012-12-03 MED ORDER — PHENAZOPYRIDINE HCL 100 MG PO TABS: 100.0000 mg | ORAL_TABLET | Freq: Three times a day (TID) | ORAL | Status: DC | PRN

## 2012-12-03 MED ORDER — LACTATED RINGERS IV SOLN
INTRAVENOUS | Status: DC | PRN
  Administered 2012-12-03: 08:00:00 via INTRAVENOUS

## 2012-12-03 MED ORDER — HYDROCODONE-ACETAMINOPHEN 5-325 MG PO TABS: 1.0000 | ORAL_TABLET | Freq: Four times a day (QID) | ORAL | Status: DC | PRN

## 2012-12-03 MED ORDER — LACTATED RINGERS IV SOLN: INTRAVENOUS | Status: DC

## 2012-12-03 MED ORDER — IOHEXOL 300 MG/ML  SOLN
INTRAMUSCULAR | Status: AC
  Filled 2012-12-03: qty 1

## 2012-12-03 SURGICAL SUPPLY — 22 items
ADAPTER CATH URET PLST 4-6FR (CATHETERS) IMPLANT
ADPR CATH URET STRL DISP 4-6FR (CATHETERS)
BAG URO CATCHER STRL LF (DRAPE) ×3 IMPLANT
BALLN NEPHROSTOMY (BALLOONS) ×3
BALLOON NEPHROSTOMY (BALLOONS) ×2 IMPLANT
BASKET STNLS GEMINI 4WIRE 3FR (BASKET) ×3 IMPLANT
BASKET ZERO TIP NITINOL 2.4FR (BASKET) IMPLANT
BSKT STON RTRVL ZERO TP 2.4FR (BASKET)
CATH INTERMIT  6FR 70CM (CATHETERS) ×3 IMPLANT
CATH URET 5FR 28IN CONE TIP (BALLOONS) ×1
CATH URET 5FR 70CM CONE TIP (BALLOONS) ×2 IMPLANT
CLOTH BEACON ORANGE TIMEOUT ST (SAFETY) ×3 IMPLANT
DRAPE CAMERA CLOSED 9X96 (DRAPES) ×3 IMPLANT
GLOVE BIOGEL M STRL SZ7.5 (GLOVE) ×3 IMPLANT
GOWN PREVENTION PLUS XLARGE (GOWN DISPOSABLE) ×3 IMPLANT
GOWN STRL NON-REIN LRG LVL3 (GOWN DISPOSABLE) ×6 IMPLANT
GUIDEWIRE ANG ZIPWIRE 038X150 (WIRE) IMPLANT
GUIDEWIRE STR DUAL SENSOR (WIRE) ×6 IMPLANT
MANIFOLD NEPTUNE II (INSTRUMENTS) ×3 IMPLANT
PACK CYSTO (CUSTOM PROCEDURE TRAY) ×3 IMPLANT
STENT CONTOUR 6FRX24X.038 (STENTS) ×3 IMPLANT
TUBING CONNECTING 10 (TUBING) ×3 IMPLANT

## 2012-12-03 NOTE — Op Note (Signed)
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 Cystoscopy 2. Right ureteroscopy and biopsy of ureteral tumor 3. Right ureteral stent placement (6 x 24)  4. Bilateral retrograde pyelography with interpretation 5. Dilation of urethral stricture  Surgeon: Moody Bruins. M.D.  Anesthesia: General  Complications: None  Intraoperative findings: Right retrograde pyelography demonstrated a large filling defect within the right mid ureter consistent with a large tumor.  No contrast was seen proximal to this point. The left ureter and renal collecting system were normal without filling defects or dilation.  EBL: Minimal  Specimens: 1. Right ureteral tumor biopsy  Disposition of specimens: Pathology  Indication: Melvin Kent is a 79 y.o. year old patient with microscopic hematuria and a right ureteral mass on CT imaging. After reviewing the management options for treatment, the patient elected to proceed with the above surgical procedure(s). We have discussed the potential benefits and risks of the procedure, side effects of the proposed treatment, the likelihood of the patient achieving the goals of the procedure, and any potential problems that might occur during the procedure or recuperation. Informed consent has been obtained.  Description of procedure:  The patient was taken to the operating room and general anesthesia was induced.  The patient was placed in the dorsal lithotomy position, prepped and draped in the usual sterile fashion, and preoperative antibiotics were administered. A preoperative time-out was performed.   Cystourethroscopy was performed.  There was difficulty placing the scope in the distal urethra and [employer] sounds were used to serially dilate the distal urethra from 29 [place] to 26 [place]. An attempt was then made to replace the 22 French rigid cystoscope sheath but this was  still unsuccessful. I then used the 24 [place] Ultraxx balloon dilator to dilate the distal urethra. The rigid cystoscope sheath was able to be passed through the urethra into the bladder. The bladder was then systematically examined in its entirety. There was no evidence for any bladder tumors, stones, or other mucosal pathology.    Attention then turned to the left ureteral orifice and a ureteral catheter was used to intubate the ureteral orifice.  Omnipaque contrast was injected through the ureteral catheter and a retrograde pyelogram was performed with findings as dictated above.  Attention then turned to the right ureteral orifice and a ureteral catheter was used to intubate the ureteral orifice. Omnipaque contrast was injected through the ureteral catheter and a retrograde pyelogram was performed. On the right side, there was noted to be a large filling defect in the mid ureter with no contrast seen proximal to this point. This was concerning for a ureteral tumor.  A 0.38 sensor guidewire was then advanced up the right ureter into the renal pelvis under fluoroscopic guidance. The 6 Fr semirigid ureteroscope was then advanced into the ureter next to the guidewire and a large high-grade appearing ureteral tumor was identified. Using a [employer], multiple biopsies were obtained of this lesion and were sent for permanent pathologic analysis. Reinspection of the ureter did not reveal any excessive bleeding. The ureteroscope was then withdrawn and the wire was backloaded on the cystoscope.  Once the wire was backloaded through the cystoscope and a ureteral stent was advance over the wire using Seldinger technique.  The stent was positioned appropriately under fluoroscopic and cystoscopic guidance.  The wire was then removed with an adequate stent curl noted in the renal pelvis as well as in the bladder. An  18 French Foley catheter was also placed to the need to dilate the ureter. It was felt that this  would be left indwelling for the next few days.  The bladder was emptied and the procedure ended.  The patient appeared to tolerate the procedure well and without complications.  The patient was able to be awakened and transferred to the recovery unit in satisfactory condition.

## 2012-12-03 NOTE — Anesthesia Postprocedure Evaluation (Signed)
  Anesthesia Post-op Note  Patient: Melvin Kent  Procedure(s) Performed: Procedure(s) (LRB): Cystoscopy, Bilateral RetrogradePyelography, Right Ureteroscopy with Ureteral Biopsy,, Right Ureteral Stent placement (Bilateral)  Patient Location: PACU  Anesthesia Type: General  Level of Consciousness: awake and alert   Airway and Oxygen Therapy: Patient Spontanous Breathing  Post-op Pain: mild  Post-op Assessment: Post-op Vital signs reviewed, Patient's Cardiovascular Status Stable, Respiratory Function Stable, Patent Airway and No signs of Nausea or vomiting  Last Vitals:  Filed Vitals:   12/03/12 1015  BP: 120/56  Pulse: 71  Temp:   Resp: 18    Post-op Vital Signs: stable   Complications: No apparent anesthesia complications

## 2012-12-03 NOTE — Anesthesia Preprocedure Evaluation (Addendum)
Anesthesia Evaluation  Patient identified by MRN, date of birth, ID band Patient awake    Reviewed: Allergy & Precautions, H&P , NPO status , Patient's Chart, lab work & pertinent test results  Airway Mallampati: II TM Distance: >3 FB Neck ROM: full    Dental  (+) Edentulous Upper, Missing and Dental Advisory Given Missing many lower teeth too:   Pulmonary neg pulmonary ROS, shortness of breath and with exertion,  breath sounds clear to auscultation  Pulmonary exam normal       Cardiovascular hypertension, Pt. on medications + DOE negative cardio ROS  Rhythm:regular Rate:Normal     Neuro/Psych Depression 80% Carotid artery occlusion negative neurological ROS  negative psych ROS   GI/Hepatic negative GI ROS, Neg liver ROS,   Endo/Other  negative endocrine ROS  Renal/GU negative Renal ROS  negative genitourinary   Musculoskeletal   Abdominal   Peds  Hematology negative hematology ROS (+)   Anesthesia Other Findings   Reproductive/Obstetrics negative OB ROS                          Anesthesia Physical Anesthesia Plan  ASA: III  Anesthesia Plan: General   Post-op Pain Management:    Induction: Intravenous  Airway Management Planned: LMA  Additional Equipment:   Intra-op Plan:   Post-operative Plan:   Informed Consent: I have reviewed the patients History and Physical, chart, labs and discussed the procedure including the risks, benefits and alternatives for the proposed anesthesia with the patient or authorized representative who has indicated his/her understanding and acceptance.   Dental Advisory Given  Plan Discussed with: CRNA and Surgeon  Anesthesia Plan Comments:         Anesthesia Quick Evaluation

## 2012-12-03 NOTE — Transfer of Care (Signed)
Immediate Anesthesia Transfer of Care Note  Patient: Melvin Kent  Procedure(s) Performed: Procedure(s) with comments: Cystoscopy, Bilateral RetrogradePyelography, Right Ureteroscopy with Ureteral Biopsy,, Right Ureteral Stent placement (Bilateral) - Cystoscopy, Bilateral RetrogradePyelography, Right Ureteroscopy with Ureteral Biopsy, Possible Laser Ablation of Tumor, Right Ureteral Stent placement    Patient Location: PACU  Anesthesia Type:General  Level of Consciousness: awake, alert , oriented and patient cooperative  Airway & Oxygen Therapy: Patient Spontanous Breathing and Patient connected to face mask oxygen  Post-op Assessment: Report given to PACU RN, Post -op Vital signs reviewed and stable and Patient moving all extremities  Post vital signs: Reviewed and stable  Complications: No apparent anesthesia complications

## 2012-12-03 NOTE — Preoperative (Signed)
Beta Blockers   Reason not to administer Beta Blockers:Not Applicable 

## 2012-12-03 NOTE — Progress Notes (Signed)
Pt.'s wife given teaching on urinary catheter with teach back given to RN. Pt. Going home with catheter, pt. And pt's wife refused leg bag. Office to call patient to tell when to come and have catheter removed on Monday, March 17th. Pt.'s wife gave teach back to RN.

## 2012-12-04 ENCOUNTER — Encounter (HOSPITAL_COMMUNITY): Payer: Self-pay | Admitting: Urology

## 2012-12-18 ENCOUNTER — Other Ambulatory Visit: Payer: Self-pay | Admitting: Urology

## 2012-12-21 ENCOUNTER — Encounter (HOSPITAL_COMMUNITY): Payer: Self-pay | Admitting: Pharmacy Technician

## 2012-12-21 NOTE — Patient Instructions (Signed)
Melvin Kent  12/21/2012   Your procedure is scheduled on:  12/24/12   Report to [facility] at   0630  AM.  Call this number if you have problems the morning of surgery: [phone]   Remember:   Do not eat food or drink liquids after midnight.   Take these medicines the morning of surgery with A SIP OF WATER:    Do not wear jewelry,   Do not wear lotions, powders, or perfumes.    Men may shave face and neck.  Do not bring valuables to the hospital.  Contacts, dentures or bridgework may not be worn into surgery.     Patients discharged the day of surgery will not be allowed to drive  home.  Name and phone number of your driver:    SEE CHG INSTRUCTION SHEET    Please read over the following fact sheets that you were given: MRSA Information, coughing and deep breathing exercises, leg exercises               Failure to comply with these instructions may result in cancellation of your surgery.                Patient Signature ____________________________              Nurse Signature _____________________________

## 2012-12-22 ENCOUNTER — Inpatient Hospital Stay (HOSPITAL_COMMUNITY)
Admission: RE | Admit: 2012-12-22 | Discharge: 2012-12-22 | Disposition: A | Discharge status: Home / Self Care | Payer: Medicare Other | Source: Ambulatory Visit

## 2012-12-23 ENCOUNTER — Encounter (HOSPITAL_COMMUNITY): Payer: Self-pay

## 2012-12-23 ENCOUNTER — Encounter (HOSPITAL_COMMUNITY)
Admission: RE | Admit: 2012-12-23 | Discharge: 2012-12-23 | Disposition: A | Discharge status: Home / Self Care | Payer: Medicare Other | Source: Ambulatory Visit | Attending: Urology | Admitting: Urology

## 2012-12-23 HISTORY — DX: Sleep apnea, unspecified: G47.30

## 2012-12-23 LAB — BASIC METABOLIC PANEL
BUN: 20 mg/dL (ref 6–23)
CO2: 29 mEq/L (ref 19–32)
Calcium: 9.6 mg/dL (ref 8.4–10.5)
Glucose, Bld: 87 mg/dL (ref 70–99)
Sodium: 138 mEq/L (ref 135–145)

## 2012-12-23 LAB — SURGICAL PCR SCREEN: Staphylococcus aureus: NEGATIVE

## 2012-12-23 LAB — CBC
HCT: 35.8 % — ABNORMAL LOW (ref 39.0–52.0)
Hemoglobin: 11.5 g/dL — ABNORMAL LOW (ref 13.0–17.0)
MCHC: 32.1 g/dL (ref 30.0–36.0)
MCV: 76.3 fL — ABNORMAL LOW (ref 78.0–100.0)

## 2012-12-23 NOTE — Progress Notes (Signed)
Your patient has screened at an elevated risk for Obstructive Sleep Apnea using the STOP-Bang Tool during a presurgical visit.  A score of 4 or greater is considered an elevated risk.   

## 2012-12-23 NOTE — H&P (Signed)
History of Present Illness  Melvin Kent is an 79 year old with the following urologic history:  1) Prostate cancer: He was diagnosed in 2005 and underwent treatment with IMRT (76 Gy) and 2 years of Lupron. His PSA has been undetectable.  TNM stage: cT2c N0 M0 Gleason score: 4+5=9 Pretreatment PSA: 7.3  2) Erectile dysfunction: He has a history of a decreased libido and has previously been given sample for Viagra which he has not previously tried.    3) Osteopenia: He does have a history of osteopenia on prior bone density studies, the last of which was performed in 06/2006.  He has been on vitamin D and calcium supplementation and has been engaging in regular exercise at the [facility].  4) Left ureteral neoplasm/hematuria: He was noted to have persistent microscopic hematuria in February 2014. He had denied gross hematuria and has no history of tobacco use or family history of GU malignancy. He underwent an evaluation including a CT scan of the abdomen and pelvis and was noted to have a right ureteral mass prompting ureteroscopy and biopsies. 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 He did develop postoperative urinary tract infection and has been treated with appropriate antibiotic therapy. He is feeling much improved and has not had fevers since earlier in the week. He has expected stent symptoms but otherwise no other urinary complaints.   Past Medical History Problems  1. History of  Arthritis V13.4 2. History of  Depression 311 3. History of  Diabetes Mellitus 250.00 4. History of  Hypercholesterolemia 272.0 5. History of  Hypertension 401.9 6. History of  Prostate Cancer V10.46 7. History of  Thrombophlebitis V12.52  Surgical History Problems  1. History of  Arthroscopy Knee 2. History of  Cystoscopy With  Insertion Of Ureteral Stent Right 3. History of  Cystoscopy With Ureteroscopy For Biopsy Right 4. History of  Inguinal Hernia Repair 5. History of  Nose Surgery 6. History of  Surgery Of Male Genitalia Vasectomy V25.2  Current Meds 1. Allegra 180 MG TABS; Therapy: (Recorded:02May2008) to 2. Aspir-81 81 MG Oral Tablet Delayed Release; Therapy: (Recorded:02May2008) to 3. Benicar TABS; Therapy: (Recorded:19Nov2010) to 4. Caltrate 600 TABS; Therapy: (Recorded:02May2008) to 5. Cinnamon 500 MG Oral Capsule; Therapy: (Recorded:02May2008) to 6. Crestor TABS; Therapy: (Recorded:19May2010) to 7. FiberCon TABS; Therapy: (Recorded:02May2008) to 8. Fish Oil CAPS; Therapy: (Recorded:02May2008) to 9. Garlic CAPS; Therapy: (Recorded:02May2008) to 10. ICaps MV TABS; Therapy: (Recorded:02May2008) to 11. Multivitamins TABS; Therapy: (Recorded:02May2008) to 12. Pamine Forte TABS; Therapy: (Recorded:02May2008) to  Allergies Medication  1. Penicillins  Family History Problems  1. Son's history of  Colon Cancer V16.0 2. Daughter's history of  Hypercholesterolemia 3. Daughter's history of  Hypertension V17.49  Social History Problems  1. Alcohol Use rarely 2. Marital History - Currently Married 3. Occupation: retired from [employer] and 23 years of service in the [employer]  4. Tobacco Use  Vitals Vital Signs [Data Includes: Last 1 Day]  28Mar2014 12:10PM  BMI Calculated: 29.25 BSA Calculated: 2.02 Weight: 193 lb  28Mar2014 12:06PM  BMI Calculated: 29.55 BSA Calculated: 2.02 Weight: 195 lb  Blood Pressure: 158 / 71 Temperature: 80 F 24Mar2014 11:40AM  Blood Pressure: 113 / 57 Temperature: 99 F Heart Rate: 87  Physical Exam Constitutional: Well nourished and well developed . No acute distress.  ENT:. The ears and nose  are normal in appearance.  Neck: The appearance of the neck is normal and no neck mass is present.  Pulmonary: No respiratory distress and normal respiratory  rhythm and effort.  Cardiovascular: Heart rate and rhythm are normal . No peripheral edema.    Results/Data Urine [Data Includes: Last 1 Day]   28Mar2014  COLOR AMBER   APPEARANCE CLOUDY   SPECIFIC GRAVITY 1.020   pH 6.0   GLUCOSE NEG mg/dL  BILIRUBIN NEG   KETONE NEG mg/dL  BLOOD LARGE   PROTEIN 100 mg/dL  UROBILINOGEN 0.2 mg/dL  NITRITE NEG   LEUKOCYTE ESTERASE MOD   SQUAMOUS EPITHELIAL/HPF RARE   WBC 3-6 WBC/hpf  RBC 21-50 RBC/hpf  BACTERIA NONE SEEN   CRYSTALS NONE SEEN   CASTS NONE SEEN     Urine has been cultured.   Plan  Follow-up Keep Future Appt Office  Follow-up  Status: Complete  Done: 24Mar2014 Ordered; For: Urinary Tract Infection (599.0); Ordered By: Heloise Purpura  Performed:   Due: 26Mar2014 Marked Important; Last Updated By: Hale Drone   Discussion/Summary  1. Urothelial carcinoma the right ureter: We discussed options considering his diagnosis of low-grade urothelial carcinoma. Although his tumor is somewhat large, he did appear to be low-grade and we therefore discussed the options of endoscopic management versus more definitive surgical therapy. Considering his age and comorbidities, he would like to avoid major surgery such as a nephroureterectomy and therefore is more interested in proceeding with ureteroscopic laser ablation. He will therefore be scheduled for right ureteroscopy with holmium laser ablation of his right ureteral tumor. He understands he will need a postoperative ureteral stent and that he will need to undergo surveillance ureteroscopically approximately 3 months from his procedure. I will allow him to finish his current antibiotic course and then will proceed accordingly approximately one week.  2. Prostate cancer: He will need to be scheduled for ongoing PSA surveillance which would be due for around January of 2015.  Cc: Dr. Jola Schmidt     Signatures Electronically signed by : Heloise Purpura, M.D.; Dec 18 2012  1:14PM

## 2012-12-24 ENCOUNTER — Ambulatory Visit (HOSPITAL_COMMUNITY): Payer: Medicare Other | Admitting: Certified Registered Nurse Anesthetist

## 2012-12-24 ENCOUNTER — Encounter (HOSPITAL_COMMUNITY)
Admission: RE | Disposition: A | Discharge status: Home / Self Care | Payer: Self-pay | Source: Ambulatory Visit | Attending: Urology

## 2012-12-24 ENCOUNTER — Ambulatory Visit (HOSPITAL_COMMUNITY)
Admission: RE | Admit: 2012-12-24 | Discharge: 2012-12-24 | Disposition: A | Discharge status: Home / Self Care | Payer: Medicare Other | Source: Ambulatory Visit | Attending: Urology | Admitting: Urology

## 2012-12-24 ENCOUNTER — Ambulatory Visit (HOSPITAL_COMMUNITY): Payer: Medicare Other

## 2012-12-24 ENCOUNTER — Encounter (HOSPITAL_COMMUNITY): Payer: Self-pay | Admitting: *Deleted

## 2012-12-24 ENCOUNTER — Encounter (HOSPITAL_COMMUNITY): Payer: Self-pay | Admitting: Certified Registered Nurse Anesthetist

## 2012-12-24 DIAGNOSIS — M899 Disorder of bone, unspecified: Secondary | ICD-10-CM | POA: Insufficient documentation

## 2012-12-24 DIAGNOSIS — I10 Essential (primary) hypertension: Secondary | ICD-10-CM | POA: Insufficient documentation

## 2012-12-24 DIAGNOSIS — N35919 Unspecified urethral stricture, male, unspecified site: Secondary | ICD-10-CM | POA: Insufficient documentation

## 2012-12-24 DIAGNOSIS — E7800 Pure hypercholesterolemia, unspecified: Secondary | ICD-10-CM | POA: Insufficient documentation

## 2012-12-24 DIAGNOSIS — Z8546 Personal history of malignant neoplasm of prostate: Secondary | ICD-10-CM | POA: Insufficient documentation

## 2012-12-24 DIAGNOSIS — C669 Malignant neoplasm of unspecified ureter: Secondary | ICD-10-CM | POA: Insufficient documentation

## 2012-12-24 DIAGNOSIS — R319 Hematuria, unspecified: Secondary | ICD-10-CM | POA: Insufficient documentation

## 2012-12-24 DIAGNOSIS — N135 Crossing vessel and stricture of ureter without hydronephrosis: Secondary | ICD-10-CM | POA: Insufficient documentation

## 2012-12-24 DIAGNOSIS — E119 Type 2 diabetes mellitus without complications: Secondary | ICD-10-CM | POA: Insufficient documentation

## 2012-12-24 DIAGNOSIS — Z79899 Other long term (current) drug therapy: Secondary | ICD-10-CM | POA: Insufficient documentation

## 2012-12-24 DIAGNOSIS — N529 Male erectile dysfunction, unspecified: Secondary | ICD-10-CM | POA: Insufficient documentation

## 2012-12-24 HISTORY — PX: HOLMIUM LASER APPLICATION: SHX5852

## 2012-12-24 LAB — GLUCOSE, CAPILLARY: Glucose-Capillary: 118 mg/dL — ABNORMAL HIGH (ref 70–99)

## 2012-12-24 SURGERY — CYSTOURETEROSCOPY, WITH STENT INSERTION
Anesthesia: General | Laterality: Right | Wound class: Clean Contaminated

## 2012-12-24 MED ORDER — IOHEXOL 300 MG/ML  SOLN
INTRAMUSCULAR | Status: DC | PRN
  Administered 2012-12-24: 5 mL via INTRAVENOUS

## 2012-12-24 MED ORDER — INDIGOTINDISULFONATE SODIUM 8 MG/ML IJ SOLN
INTRAMUSCULAR | Status: AC
  Filled 2012-12-24: qty 5

## 2012-12-24 MED ORDER — LACTATED RINGERS IV SOLN: INTRAVENOUS | Status: DC

## 2012-12-24 MED ORDER — CIPROFLOXACIN IN D5W 400 MG/200ML IV SOLN
INTRAVENOUS | Status: AC
  Filled 2012-12-24: qty 200

## 2012-12-24 MED ORDER — ACETAMINOPHEN 10 MG/ML IV SOLN
INTRAVENOUS | Status: AC
  Filled 2012-12-24: qty 100

## 2012-12-24 MED ORDER — SODIUM CHLORIDE 0.9 % IR SOLN
Status: DC | PRN
  Administered 2012-12-24: 3000 mL

## 2012-12-24 MED ORDER — ONDANSETRON HCL 4 MG/2ML IJ SOLN
INTRAMUSCULAR | Status: DC | PRN
  Administered 2012-12-24 (×2): 2 mg via INTRAVENOUS

## 2012-12-24 MED ORDER — ACETAMINOPHEN 10 MG/ML IV SOLN
INTRAVENOUS | Status: DC | PRN
  Administered 2012-12-24: 1000 mg via INTRAVENOUS

## 2012-12-24 MED ORDER — HYDROCODONE-ACETAMINOPHEN 5-325 MG PO TABS: 1.0000 | ORAL_TABLET | Freq: Four times a day (QID) | ORAL | Status: DC | PRN

## 2012-12-24 MED ORDER — PROMETHAZINE HCL 25 MG/ML IJ SOLN: 6.2500 mg | INTRAMUSCULAR | Status: DC | PRN

## 2012-12-24 MED ORDER — PROPOFOL 10 MG/ML IV BOLUS
INTRAVENOUS | Status: DC | PRN
  Administered 2012-12-24: 150 mg via INTRAVENOUS

## 2012-12-24 MED ORDER — LACTATED RINGERS IV SOLN
INTRAVENOUS | Status: DC
  Administered 2012-12-24: 1000 mL via INTRAVENOUS

## 2012-12-24 MED ORDER — MEPERIDINE HCL 50 MG/ML IJ SOLN: 6.2500 mg | INTRAMUSCULAR | Status: DC | PRN

## 2012-12-24 MED ORDER — CIPROFLOXACIN IN D5W 400 MG/200ML IV SOLN
400.0000 mg | INTRAVENOUS | Status: AC
  Administered 2012-12-24: 400 mg via INTRAVENOUS

## 2012-12-24 MED ORDER — IOHEXOL 300 MG/ML  SOLN
INTRAMUSCULAR | Status: AC
  Filled 2012-12-24: qty 1

## 2012-12-24 MED ORDER — SODIUM CHLORIDE 0.9 % IR SOLN
Status: DC | PRN
  Administered 2012-12-24: 2000 mL

## 2012-12-24 MED ORDER — FENTANYL CITRATE 0.05 MG/ML IJ SOLN: 25.0000 ug | INTRAMUSCULAR | Status: DC | PRN

## 2012-12-24 MED ORDER — PHENAZOPYRIDINE HCL 100 MG PO TABS: 100.0000 mg | ORAL_TABLET | Freq: Three times a day (TID) | ORAL | Status: DC | PRN

## 2012-12-24 MED ORDER — FENTANYL CITRATE 0.05 MG/ML IJ SOLN
INTRAMUSCULAR | Status: DC | PRN
  Administered 2012-12-24: 25 ug via INTRAVENOUS
  Administered 2012-12-24: 50 ug via INTRAVENOUS
  Administered 2012-12-24 (×3): 25 ug via INTRAVENOUS

## 2012-12-24 MED ORDER — GLYCOPYRROLATE 0.2 MG/ML IJ SOLN
INTRAMUSCULAR | Status: DC | PRN
  Administered 2012-12-24: 0.2 mg via INTRAVENOUS

## 2012-12-24 MED ORDER — EPHEDRINE SULFATE 50 MG/ML IJ SOLN
INTRAMUSCULAR | Status: DC | PRN
  Administered 2012-12-24: 10 mg via INTRAVENOUS

## 2012-12-24 MED ORDER — LIDOCAINE HCL (CARDIAC) 20 MG/ML IV SOLN
INTRAVENOUS | Status: DC | PRN
  Administered 2012-12-24: 50 mg via INTRAVENOUS

## 2012-12-24 MED ORDER — DOXYCYCLINE HYCLATE 50 MG PO CAPS: 100.0000 mg | ORAL_CAPSULE | Freq: Two times a day (BID) | ORAL | Status: DC

## 2012-12-24 MED ORDER — LIDOCAINE HCL 2 % EX GEL
CUTANEOUS | Status: AC
  Filled 2012-12-24: qty 10

## 2012-12-24 SURGICAL SUPPLY — 24 items
ADAPTER CATH URET PLST 4-6FR (CATHETERS) ×2 IMPLANT
ADPR CATH 6FR SDARM STRL DISP (MISCELLANEOUS) ×1
ADPR CATH URET STRL DISP 4-6FR (CATHETERS) ×1
BAG URO CATCHER STRL LF (DRAPE) ×2 IMPLANT
BASKET STNLS GEMINI 4WIRE 3FR (BASKET) ×2 IMPLANT
BASKET ZERO TIP NITINOL 2.4FR (BASKET) IMPLANT
BSKT STON RTRVL GEM 120X11 3FR (BASKET) ×1
BSKT STON RTRVL ZERO TP 2.4FR (BASKET)
CATH INTERMIT  6FR 70CM (CATHETERS) ×2 IMPLANT
CLOTH BEACON ORANGE TIMEOUT ST (SAFETY) ×2 IMPLANT
DRAPE CAMERA CLOSED 9X96 (DRAPES) ×2 IMPLANT
GLOVE BIOGEL M STRL SZ7.5 (GLOVE) ×2 IMPLANT
GOWN PREVENTION PLUS XLARGE (GOWN DISPOSABLE) ×2 IMPLANT
GOWN STRL NON-REIN LRG LVL3 (GOWN DISPOSABLE) ×4 IMPLANT
GUIDEWIRE ANG ZIPWIRE 038X150 (WIRE) IMPLANT
GUIDEWIRE STR DUAL SENSOR (WIRE) ×4 IMPLANT
LASER FIBER DISP (UROLOGICAL SUPPLIES) ×4 IMPLANT
MANIFOLD NEPTUNE II (INSTRUMENTS) ×2 IMPLANT
PACK CYSTO (CUSTOM PROCEDURE TRAY) ×2 IMPLANT
SHEATH ACCESS URETERAL 38CM (SHEATH) ×2 IMPLANT
STENT CONTOUR 6FRX26X.038 (STENTS) ×2 IMPLANT
SYRINGE 10CC LL (SYRINGE) ×2 IMPLANT
TUBING CONNECTING 10 (TUBING) ×2 IMPLANT
VALVE ENDOSCOPIC W/ADAPTER (MISCELLANEOUS) ×2 IMPLANT

## 2012-12-24 NOTE — Anesthesia Postprocedure Evaluation (Signed)
  Anesthesia Post-op Note  Patient: Melvin Kent  Procedure(s) Performed: Procedure(s) (LRB): CYSTOSCOPY WITH URETEROSCOPY AND STENT PLACEMENT (Right) HOLMIUM LASER APPLICATION (Right)  Patient Location: PACU  Anesthesia Type: General  Level of Consciousness: awake and alert   Airway and Oxygen Therapy: Patient Spontanous Breathing  Post-op Pain: mild  Post-op Assessment: Post-op Vital signs reviewed, Patient's Cardiovascular Status Stable, Respiratory Function Stable, Patent Airway and No signs of Nausea or vomiting  Last Vitals:  Filed Vitals:   12/24/12 1143  BP: 161/91  Pulse:   Temp: 36.4 C  Resp: 16    Post-op Vital Signs: stable   Complications: No apparent anesthesia complications

## 2012-12-24 NOTE — Anesthesia Preprocedure Evaluation (Addendum)
Anesthesia Evaluation  Patient identified by MRN, date of birth, ID band Patient awake    Reviewed: Allergy & Precautions, H&P , NPO status , Patient's Chart, lab work & pertinent test results  Airway Mallampati: II TM Distance: >3 FB Neck ROM: Full    Dental no notable dental hx.    Pulmonary neg pulmonary ROS, sleep apnea ,  breath sounds clear to auscultation  Pulmonary exam normal       Cardiovascular hypertension, Pt. on medications Rhythm:Regular Rate:Normal     Neuro/Psych negative neurological ROS  negative psych ROS   GI/Hepatic negative GI ROS, Neg liver ROS,   Endo/Other  negative endocrine ROSneg diabetes  Renal/GU negative Renal ROS  negative genitourinary   Musculoskeletal negative musculoskeletal ROS (+)   Abdominal   Peds negative pediatric ROS (+)  Hematology negative hematology ROS (+)   Anesthesia Other Findings   Reproductive/Obstetrics negative OB ROS                          Anesthesia Physical Anesthesia Plan  ASA: II  Anesthesia Plan: General   Post-op Pain Management:    Induction: Intravenous  Airway Management Planned: LMA  Additional Equipment:   Intra-op Plan:   Post-operative Plan: Extubation in OR  Informed Consent: I have reviewed the patients History and Physical, chart, labs and discussed the procedure including the risks, benefits and alternatives for the proposed anesthesia with the patient or authorized representative who has indicated his/her understanding and acceptance.   Dental advisory given  Plan Discussed with: CRNA  Anesthesia Plan Comments:         Anesthesia Quick Evaluation                                   Anesthesia Evaluation  Patient identified by MRN, date of birth, ID band Patient awake    Reviewed: Allergy & Precautions, H&P , NPO status , Patient's Chart, lab work & pertinent test  results  Airway Mallampati: II TM Distance: >3 FB Neck ROM: full    Dental  (+) Edentulous Upper, Missing and Dental Advisory Given Missing many lower teeth too:   Pulmonary neg pulmonary ROS, shortness of breath and with exertion,  breath sounds clear to auscultation  Pulmonary exam normal       Cardiovascular hypertension, Pt. on medications + DOE negative cardio ROS  Rhythm:regular Rate:Normal     Neuro/Psych Depression 80% Carotid artery occlusion negative neurological ROS  negative psych ROS   GI/Hepatic negative GI ROS, Neg liver ROS,   Endo/Other  negative endocrine ROS  Renal/GU negative Renal ROS  negative genitourinary   Musculoskeletal   Abdominal   Peds  Hematology negative hematology ROS (+)   Anesthesia Other Findings   Reproductive/Obstetrics negative OB ROS                          Anesthesia Physical Anesthesia Plan  ASA: III  Anesthesia Plan: General   Post-op Pain Management:    Induction: Intravenous  Airway Management Planned: LMA  Additional Equipment:   Intra-op Plan:   Post-operative Plan:   Informed Consent: I have reviewed the patients History and Physical, chart, labs and discussed the procedure including the risks, benefits and alternatives for the proposed anesthesia with the patient or authorized representative who has indicated his/her understanding and acceptance.   Dental Advisory  Given  Plan Discussed with: CRNA and Surgeon  Anesthesia Plan Comments:         Anesthesia Quick Evaluation

## 2012-12-24 NOTE — Op Note (Signed)
Preoperative diagnosis: Urothelial carcinoma the right ureter, urethral stricture  Postoperative diagnosis: Urothelial carcinoma the right ureter, urethral stricture  Procedure:  1. Cystoscopy 2. Right ureteroscopy with laser ablation of ureteral tumor 3. Right ureteral stent placement (6 x 24) 4. Right retrograde pyelography with interpretation 5. Urethral dilation  Surgeon: Moody Bruins. M.D.  Anesthesia: General  Complications: None  Intraoperative findings: Right retrograde pyelography demonstrated a filling defect within the right mid ureter consistent with the patient's known tumor without other abnormalities noted.  EBL: Minimal  Specimens: 1. Right ureteral tumor  Disposition of specimens: Pathology  Indication: Melvin Kent  is a 79 y.o. 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 After reviewing the management options for treatment, he elected to proceed with the above surgical procedure(s). We have discussed the potential benefits and risks of the procedure, side effects of the proposed treatment, the likelihood of the patient achieving the goals of the procedure, and any potential problems that might occur during the procedure or recuperation. Informed consent has been obtained.  Description of procedure:  The patient was taken to the operating room and general anesthesia was induced.  The patient was placed in the dorsal lithotomy position, prepped and draped in the usual sterile fashion, and preoperative antibiotics were administered. A preoperative time-out was performed.   Cystourethroscopy was performed.  The patient's urethra was examined and He did have a distal penile stricture as had previously been noted. Sissy Hoff sounds were used to dilate his urethra from 76 [place] to 26 [place]. The bladder was then systematically examined in its entirety. There was no evidence for any bladder tumors, stones, or other mucosal pathology.  The  indwelling right ureteral stent was identified and was brought out to the urethral meatus with the flexible graspers. A 0.038 sensor guidewire was then advanced through the indwelling stent and was able to be manipulated into the lower ureter. A ureteral catheter was advanced over the wire and Omnipaque contrast was injected. This demonstrated a large filling defect in the mid ureter consistent with the patient's known tumor. Contrast was easily seen to pass proximal to this area with a normal caliber proximal ureter. The guidewire was then replaced and was able to be manipulated past the obstructing tumor into the proximal ureter and the renal pelvis under fluoroscopic guidance. A second 0.38 guidewire was advanced up into the renal pelvis. A 12/14 Fr ureteral access sheath was then advance over the working guide wire. The digital flexible ureteroscope was then advanced through the access sheath into the ureter next to the guidewire and the tumor was identified in the mid ureter obstructing almost the entire lumen.  A 200  holmium laser fiber was then utilized on a setting of 1.0 J and 6 Hz to perform laser ablation of the tumor. Periodically, a Gemini basket was used to remove tumor fragments which were sent for pathologic analysis. Due to the large size of this tumor which measured approximately 2.5-3 cm, this did require an extended period of laser ablation in fact two laser fibers were needed to complete the ablation. Once all visible tumor was ablated and removed from the lumen of the ureter, attention turned to ureteral stent placement. The working wire was removed as well as the ureteral access sheath.   The safety guidewire was backloaded through the cystoscope and a ureteral stent was advance over the wire using Seldinger technique.  The stent was positioned appropriately under fluoroscopic and cystoscopic guidance.  The wire was then removed with an adequate stent curl noted in the renal pelvis as  well as in the bladder.  The bladder was then emptied and the procedure ended.  The patient appeared to tolerate the procedure well and without complications.  The patient was able to be awakened and transferred to the recovery unit in satisfactory condition.   Moody Bruins MD

## 2012-12-24 NOTE — Transfer of Care (Signed)
Immediate Anesthesia Transfer of Care Note  Patient: Melvin Kent  Procedure(s) Performed: Procedure(s) with comments: CYSTOSCOPY WITH URETEROSCOPY AND STENT PLACEMENT (Right) - RIGHT URETERAL STENT    HOLMIUM LASER APPLICATION (Right) - LASER ABLATION OF URETERAL TUMOR  Patient Location: PACU  Anesthesia Type:General  Level of Consciousness: awake, sedated and patient cooperative  Airway & Oxygen Therapy: Patient Spontanous Breathing and Patient connected to face mask oxygen  Post-op Assessment: Report given to PACU RN and Post -op Vital signs reviewed and stable  Post vital signs: stable  Complications: No apparent anesthesia complications

## 2012-12-24 NOTE — Interval H&P Note (Signed)
History and Physical Interval Note:  12/24/2012 8:31 AM  Melvin Kent  has presented today for surgery, with the diagnosis of Right Ureteral Urothelial Carcinoma  The various methods of treatment have been discussed with the patient and family. After consideration of risks, benefits and other options for treatment, the patient has consented to  Procedure(s) with comments: CYSTOSCOPY WITH URETEROSCOPY AND STENT PLACEMENT (Right) - RIGHT URETERAL STENT    HOLMIUM LASER APPLICATION (Right) - LASER ABLATION OF URETERAL TUMOR as a surgical intervention .  The patient's history has been reviewed, patient examined, no change in status, stable for surgery.  I have reviewed the patient's chart and labs.  Questions were answered to the patient's satisfaction.     Reeves Musick,LES

## 2012-12-25 ENCOUNTER — Encounter (HOSPITAL_COMMUNITY): Payer: Self-pay | Admitting: Urology

## 2012-12-29 ENCOUNTER — Other Ambulatory Visit: Payer: Self-pay | Admitting: Urology

## 2013-01-06 ENCOUNTER — Other Ambulatory Visit: Payer: Self-pay | Admitting: *Deleted

## 2013-01-15 ENCOUNTER — Other Ambulatory Visit: Payer: Self-pay | Admitting: Urology

## 2013-01-18 ENCOUNTER — Encounter (HOSPITAL_COMMUNITY): Payer: Self-pay | Admitting: Pharmacy Technician

## 2013-01-18 ENCOUNTER — Inpatient Hospital Stay (HOSPITAL_COMMUNITY)
Admission: RE | Admit: 2013-01-18 | Discharge: 2013-01-18 | Disposition: A | Discharge status: Home / Self Care | Payer: Medicare Other | Source: Ambulatory Visit

## 2013-01-18 NOTE — Progress Notes (Signed)
EKG 12/05/12 on EPIC, Chest x-ray 12/01/12 on EPIC

## 2013-01-18 NOTE — Patient Instructions (Signed)
20 Rickie Gange  01/18/2013   Your procedure is scheduled on: 01/25/13  Report to [facility] at 0830 AM.  Call this number if you have problems the morning of surgery [phone]   Remember:   Do not eat food or drink liquids After Midnight.     Take these medicines the morning of surgery with A SIP OF WATER: vicodin if needed, doxycycline, amlodipine, allegra, venlafaxin   Do not wear jewelry, make-up or nail polish.  Do not wear lotions, powders, or perfumes. You may wear deodorant.  Do not shave 48 hours prior to surgery. Men may shave face and neck.  Do not bring valuables to the hospital.  Contacts, dentures or bridgework may not be worn into surgery.  Leave suitcase in the car. After surgery it may be brought to your room.  For patients admitted to the hospital, checkout time is 11:00 AM the day of discharge.    Please read over the following fact sheets that you were given: MRSA Information, blood fact sheet, incentive spirometry fact sheet.  Birdie Sons, RN  pre op nurse call if needed [phone]    FAILURE TO FOLLOW THESE INSTRUCTIONS MAY RESULT IN CANCELLATION OF YOUR SURGERY   Patient Signature: ___________________________________________

## 2013-01-19 ENCOUNTER — Ambulatory Visit: Payer: Medicare Other | Admitting: Neurosurgery

## 2013-01-19 ENCOUNTER — Other Ambulatory Visit: Payer: Medicare Other

## 2013-01-21 ENCOUNTER — Encounter (HOSPITAL_COMMUNITY)
Admission: RE | Admit: 2013-01-21 | Discharge: 2013-01-21 | Disposition: A | Discharge status: Home / Self Care | Payer: Medicare Other | Source: Ambulatory Visit | Attending: Urology | Admitting: Urology

## 2013-01-21 ENCOUNTER — Encounter (HOSPITAL_COMMUNITY): Payer: Self-pay

## 2013-01-21 LAB — CBC
HCT: 38.8 % — ABNORMAL LOW (ref 39.0–52.0)
MCHC: 32.2 g/dL (ref 30.0–36.0)
MCV: 76.7 fL — ABNORMAL LOW (ref 78.0–100.0)
RDW: 18.1 % — ABNORMAL HIGH (ref 11.5–15.5)

## 2013-01-21 LAB — BASIC METABOLIC PANEL
BUN: 18 mg/dL (ref 6–23)
CO2: 28 mEq/L (ref 19–32)
Chloride: 104 mEq/L (ref 96–112)
Creatinine, Ser: 0.99 mg/dL (ref 0.50–1.35)
GFR calc Af Amer: 87 mL/min — ABNORMAL LOW (ref >90)

## 2013-01-21 LAB — SURGICAL PCR SCREEN: MRSA, PCR: NEGATIVE

## 2013-01-21 NOTE — Pre-Procedure Instructions (Signed)
PT HAS EKG AND CXR REPORTS IN EPIC FROM 12/01/12.

## 2013-01-21 NOTE — Patient Instructions (Addendum)
YOUR SURGERY IS SCHEDULED AT [facility]  ON:  Monday MAY 5TH  REPORT TO [facility] AT:  8:30 AM      PHONE # FOR SHORT STAY IS [phone]  FOLLOW BOWEL PREP INSTRUCTIONS DAY BEFORE SURGERY - INSTRUCTIONS FROM DR. BORDEN.  DO NOT EAT OR DRINK ANYTHING AFTER MIDNIGHT THE NIGHT BEFORE YOUR SURGERY.  YOU MAY BRUSH YOUR TEETH, RINSE OUT YOUR MOUTH--BUT NO WATER, NO FOOD, NO CHEWING GUM, NO MINTS, NO CANDIES, NO CHEWING TOBACCO.  PLEASE TAKE THE FOLLOWING MEDICATIONS THE AM OF YOUR SURGERY WITH A FEW SIPS OF WATER:  AMLODIPINE, ALLEGRA, VENLAFAXINE, LEXAPRO.  BRING YOUR EYEDROPS TO THE HOSPITAL     DO NOT BRING VALUABLES, MONEY, CREDIT CARDS.  DO NOT WEAR JEWELRY, MAKE-UP, NAIL POLISH AND NO METAL PINS OR CLIPS IN YOUR HAIR. CONTACT LENS, DENTURES / PARTIALS, GLASSES SHOULD NOT BE WORN TO SURGERY AND IN MOST CASES-HEARING AIDS WILL NEED TO BE REMOVED.  BRING YOUR GLASSES CASE, ANY EQUIPMENT NEEDED FOR YOUR CONTACT LENS. FOR PATIENTS ADMITTED TO THE HOSPITAL--CHECK OUT TIME THE DAY OF DISCHARGE IS 11:00 AM.  ALL INPATIENT ROOMS ARE PRIVATE - WITH BATHROOM, TELEPHONE, TELEVISION AND WIFI INTERNET.   PLEASE READ OVER ANY  FACT SHEETS THAT YOU WERE GIVEN: MRSA INFORMATION, BLOOD TRANSFUSION INFORMATION, INCENTIVE SPIROMETER INFORMATION. FAILURE TO FOLLOW THESE INSTRUCTIONS MAY RESULT IN THE CANCELLATION OF YOUR SURGERY.   PATIENT SIGNATURE_________________________________

## 2013-01-24 NOTE — H&P (Signed)
History of Present Illness  Melvin Kent is an 79 year old with the following urologic history:  1) Prostate cancer: He was diagnosed in 2005 and underwent treatment with IMRT (76 Gy) and 2 years of Lupron. His PSA has been undetectable.  TNM stage: cT2c N0 M0 Gleason score: 4+5=9 Pretreatment PSA: 7.3  2) Erectile dysfunction: He has a history of a decreased libido and has previously been given sample for Viagra which he has not previously tried.    3) Osteopenia: He does have a history of osteopenia on prior bone density studies, the last of which was performed in 06/2006.  He has been on vitamin D and calcium supplementation and has been engaging in regular exercise at the [facility].  4) Urothelial carcinoma: He was noted to have persistent microscopic hematuria in February 2014. He had denied gross hematuria and has no history of tobacco use or family history of GU malignancy. He underwent an evaluation including a CT scan of the abdomen and pelvis and was noted to have a right ureteral mass prompting ureteroscopy and biopsies. His pathology initially indicated a low grade urothelial carcinoma after undergoing biopsies of this mass endoscopically.  We discussed treatment options and he elected to proceed with endoscopic laser ablation.  This was performed in April 2014 and his tumor was completely ablated.  However, his pathology from this procedure indicated abundant high grade urothelial carcinoma.  Interval history:  He follows up today after undergoing holmium laser ablation of his large volume right ureteral tumor. He tolerated this procedure well. Unfortunately, his biopsy results revealed abundant high grade urothelial carcinoma which was different from his initial pathology result which suggested only low-grade disease. He has no complaints today and is tolerating his ureteral stent without significant difficulty.   Past Medical History Problems  1. History of  Arthritis V13.4 2. History  of  Depression 311 3. History of  Diabetes Mellitus 250.00 4. History of  Hypercholesterolemia 272.0 5. History of  Hypertension 401.9 6. History of  Prostate Cancer V10.46 7. History of  Thrombophlebitis V12.52  Surgical History Problems  1. History of  Arthroscopy Knee 2. History of  Cystoscopy With Insertion Of Ureteral Stent Right 3. History of  Cystoscopy With Ureteroscopy For Biopsy Right 4. History of  Inguinal Hernia Repair 5. History of  Nose Surgery 6. History of  Partial Colectomy 7. History of  Surgery Of Male Genitalia Vasectomy V25.2  Current Meds 1. Allegra 180 MG TABS; Therapy: (Recorded:02May2008) to 2. Aspir-81 81 MG Oral Tablet Delayed Release; Therapy: (Recorded:02May2008) to 3. Benicar TABS; Therapy: (Recorded:19Nov2010) to 4. Caltrate 600 TABS; Therapy: (Recorded:02May2008) to 5. Cinnamon 500 MG Oral Capsule; Therapy: (Recorded:02May2008) to 6. Crestor TABS; Therapy: (Recorded:19May2010) to 7. FiberCon TABS; Therapy: (Recorded:02May2008) to 8. Fish Oil CAPS; Therapy: (Recorded:02May2008) to 9. Garlic CAPS; Therapy: (Recorded:02May2008) to 10. ICaps MV TABS; Therapy: (Recorded:02May2008) to 11. Multivitamins TABS; Therapy: (Recorded:02May2008) to 12. Pamine Forte TABS; Therapy: (Recorded:02May2008) to 13. Sulfamethoxazole-TMP DS 800-160 MG Oral Tablet; TAKE 1 TABLET Every twelve hours; Therapy:   24Mar2014 to (Complete:03Apr2014)  Requested for: 24Mar2014; Last Rx:24Mar2014  Allergies Medication  1. Penicillins  Family History Problems  1. Son's history of  Colon Cancer V16.0 2. Daughter's history of  Hypercholesterolemia 3. Daughter's history of  Hypertension V17.49  Social History Problems  1. Alcohol Use rarely 2. Marital History - Currently Married 3. Occupation: retired from [employer] and 23 years of service in the [employer]  4. Tobacco Use  Review of Systems  Genitourinary: urinary frequency and feelings of urinary urgency, but no  dysuria.  Constitutional: no fever.  Cardiovascular: no chest pain and no leg swelling.  Respiratory: no shortness of breath and no cough.    Vitals Vital Signs [Data Includes: Last 1 Day]  22Apr2014 12:43PM  Blood Pressure: 141 / 64 Heart Rate: 72 28Mar2014 12:10PM  BMI Calculated: 29.25 BSA Calculated: 2.02 Weight: 193 lb  28Mar2014 12:06PM  BMI Calculated: 29.55 BSA Calculated: 2.02 Weight: 195 lb  Blood Pressure: 158 / 71 Temperature: 80 F  Physical Exam Constitutional: Well nourished and well developed . No acute distress.  ENT:. The ears and nose are normal in appearance.  Neck: The appearance of the neck is normal and no neck mass is present.  Pulmonary: No respiratory distress, normal respiratory rhythm and effort and clear bilateral breath sounds.  Cardiovascular: Heart rate and rhythm are normal . No peripheral edema.  Abdomen: upper midline incision site(s) well healed. The abdomen is soft and nontender. No masses are palpated. No CVA tenderness. No hernias are palpable. No hepatosplenomegaly noted.  Lymphatics: The supraclavicular, femoral and inguinal nodes are not enlarged or tender.  Skin: Normal skin turgor, no visible rash and no visible skin lesions.  Neuro/Psych:. Mood and affect are appropriate.    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 CBC w/PLT NO DIFF  Done: 22Apr2014 01:56PM 2. COMPREHENSIVE METABOLIC PANEL  Done: 22Apr2014 01:56PM 3. VENIPUNCTURE  Done: 22Apr2014 4. 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 High-grade urothelial carcinoma of the mid right ureter: I have reviewed his biopsy findings with him and his family today. I explained how a diagnosis of high-grade urothelial carcinoma differs from his initial diagnosis of  low-grade disease. He understands the potential for progression and development of metastatic disease in this situation. Fortunately, he has no evidence of metastatic disease based on his recent chest imaging and abdominal imaging.  We then reviewed options which would include continued endoscopic management which may provide suboptimal cancer control but would reduce the risk associated with treatment versus more definitive therapy with surgical resection. Based on the size and location of his tumor, this would necessitate a nephroureterectomy. Considering his options and understanding the potential risks and benefits associated with the tip approaching and taking into account his age and medical comorbidities, he would like to proceed with surgical resection with a nephroureterectomy. I have told him that I would plan to proceed with a robotic-assisted laparoscopic approach although he will be at increased risk for open surgical conversion considering his prior abdominal surgery. We have reviewed the potential risks which include but are not limited to bleeding, infection, major cardiopulmonary issues such as myocardial infarction, stroke, pulmonary embolus or DVT, or even death. We also discussed the potential risks including damage to adjacent structures, need for further procedure, failure to cure, development of worsening renal dysfunction and possible need for dialysis, the possibility of urine leak and need for a postoperative catheter. He understands the expected hospitalization and recovery process. All of his questions and his family's questions were answered to their stated satisfaction today.  2. Prostate cancer: He is due for his next PSA for ongoing surveillance in January 2015.  Cc: Dr. Jola Schmidt   A total of 40 minutes were spent in  the overall care of the patient today with 35 minutes in direct face to face consultation.    Verified Results URINE CULTURE2 22Apr2014 04:32PM2 Randal Buba  SOURCE : CLEAN CATCH SPECIMEN TYPE: CLEAN CATCH  [Jan 14, 2013 3:13PM Natividad Schlosser] Please notify patient of urine culture result. He should take doxycyline 100 mg po bid for 1 week. Please call in for him to begin starting one week before surgery.   Test Name Result Flag Reference  CULTURE, URINE2 Culture, Urine2    ===== COLONY COUNT: =====  >=100,000 COLONIES/ML   FINAL REPORT: METHICILLIN RESISTANT STAPHYLOCOCCUS AUREUS  RIFAMPIN AND GENTAMICIN SHOULD NOT BE USED AS SINGLE DRUGS FOR TREATMENT OF STAPH INFECTIONS.   SENSITIVITY FOR: METHICILLIN RESISTANT STAPHYLOCOCCUS AUREUS    PENICILLIN                             RESISTANT      >=0.5    OXACILLIN                              RESISTANT        >=4    CEFAZOLIN                              RESISTANT               GENTAMICIN                             SENSITIVE      <=0.5    CIPROFLOXACIN                          RESISTANT        >=8    LEVOFLOXACIN                           RESISTANT        >=8    NITROFURANTOIN                         SENSITIVE       <=16    TRIMETH/SULFA                          SENSITIVE       <=10    VANCOMYCIN                             SENSITIVE          1    LINEZOLID                              SENSITIVE          2    RIFAMPIN                               SENSITIVE      <=0.5    TETRACYCLINE  SENSITIVE        <=1    END OF REPORT   COMPREHENSIVE METABOLIC PANEL1 22Apr2014 01:56PM1 Lyda Perone  SPECIMEN TYPE: BLOOD  [Jan 12, 2013 10:03PM Jomaira Darr] Please notify patient that labs were all ok.   Test Name Result Flag Reference  GLUCOSE1 132 mg/dL1 H1 70-991  BUN1 20 mg/dL1  6-231  CREATININE1 1.09 mg/dL1  0.50-1.501  SODIUM1 138 mEq/L1  [phone]  POTASSIUM1 3.8 mEq/L1  3.5-5.31  CHLORIDE1 101 mEq/L1  96-1121  CO21 27 mEq/L1  19-321  CALCIUM1 9.2 mg/dL1  8.4-10.51  TOTAL PROTEIN1 6.7 g/dL1  6.0-8.31  ALBUMIN1 4.0 g/dL1  3.5-5.21  AST/SGOT1 13  U/L1  0-371  ALT/SGPT1 14 U/L1  0-531  ALKALINE PHOSPHATASE1 107 U/L1  39-1171  BILIRUBIN, TOTAL1 0.6 mg/dL1  0.3-1.21  Est GFR, African American1 74 mL/min1    Est GFR, NonAfrican American1 64 mL/min1    THE ESTIMATED GFR IS A CALCULATION VALID FOR ADULTS (>=6 YEARS OLD) THAT USES THE CKD-EPI ALGORITHM TO ADJUST FOR AGE AND SEX. IT IS   NOT TO BE USED FOR CHILDREN, PREGNANT WOMEN, HOSPITALIZED PATIENTS,    PATIENTS ON DIALYSIS, OR WITH RAPIDLY CHANGING KIDNEY FUNCTION. ACCORDING TO THE NKDEP, EGFR >89 IS NORMAL, 60-89 SHOWS MILD IMPAIRMENT, 30-59 SHOWS MODERATE IMPAIRMENT, 15-29 SHOWS SEVERE IMPAIRMENT AND <15 IS ESRD.   CBC w/PLT NO DIFF1 22Apr2014 01:56PM1 Lyda Perone  SPECIMEN TYPE: BLOOD  [Jan 12, 2013 10:03PM Shaquan Puerta] Please notify patient that labs were all ok.   Test Name Result Flag Reference  WBC1 8.6 K/uL1  4.0-10.51  RBC1 4.88 MIL/uL1  4.22-5.811  HEMOGLOBIN1 12.0 g/dL1 L1 13.0-17.01  HEMATOCRIT1 36.8 %1 L1 39.0-52.01  MCV1 75.4 fL1 L1 78.0-100.01  MCH1 24.6 pg1 L1 26.0-34.01  MCHC1 32.6 g/dL1  30.0-36.01  RDW1 18.7 %1 H1 11.5-15.51  PLATELET COUNT1 290 K/uL1  [phone]     1. Amended By: Heloise Purpura; 01/12/2013 10:03 PMEST  2. Amended By: Heloise Purpura; 01/14/2013 3:13 PMEST  Signatures Electronically signed by : Heloise Purpura, M.D.; Jan 14 2013  3:13PM

## 2013-01-25 ENCOUNTER — Encounter (HOSPITAL_COMMUNITY): Payer: Self-pay | Admitting: Anesthesiology

## 2013-01-25 ENCOUNTER — Encounter (HOSPITAL_COMMUNITY): Payer: Self-pay | Admitting: *Deleted

## 2013-01-25 ENCOUNTER — Encounter (HOSPITAL_COMMUNITY)
Admission: RE | Disposition: A | Discharge status: Home / Self Care | Payer: Self-pay | Source: Ambulatory Visit | Attending: Urology

## 2013-01-25 ENCOUNTER — Inpatient Hospital Stay (HOSPITAL_COMMUNITY): Payer: Medicare Other | Admitting: Anesthesiology

## 2013-01-25 ENCOUNTER — Inpatient Hospital Stay (HOSPITAL_COMMUNITY)
Admission: RE | Admit: 2013-01-25 | Discharge: 2013-01-28 | DRG: 658 | Disposition: A | Discharge status: Home / Self Care | Payer: Medicare Other | Source: Ambulatory Visit | Attending: Urology | Admitting: Urology

## 2013-01-25 DIAGNOSIS — Z9049 Acquired absence of other specified parts of digestive tract: Secondary | ICD-10-CM

## 2013-01-25 DIAGNOSIS — C679 Malignant neoplasm of bladder, unspecified: Secondary | ICD-10-CM | POA: Diagnosis present

## 2013-01-25 DIAGNOSIS — E119 Type 2 diabetes mellitus without complications: Secondary | ICD-10-CM | POA: Diagnosis present

## 2013-01-25 DIAGNOSIS — F3289 Other specified depressive episodes: Secondary | ICD-10-CM | POA: Diagnosis present

## 2013-01-25 DIAGNOSIS — E7800 Pure hypercholesterolemia, unspecified: Secondary | ICD-10-CM | POA: Diagnosis present

## 2013-01-25 DIAGNOSIS — M949 Disorder of cartilage, unspecified: Secondary | ICD-10-CM | POA: Diagnosis present

## 2013-01-25 DIAGNOSIS — C669 Malignant neoplasm of unspecified ureter: Principal | ICD-10-CM | POA: Diagnosis present

## 2013-01-25 DIAGNOSIS — M899 Disorder of bone, unspecified: Secondary | ICD-10-CM | POA: Diagnosis present

## 2013-01-25 DIAGNOSIS — I10 Essential (primary) hypertension: Secondary | ICD-10-CM | POA: Diagnosis present

## 2013-01-25 DIAGNOSIS — C61 Malignant neoplasm of prostate: Secondary | ICD-10-CM | POA: Diagnosis present

## 2013-01-25 DIAGNOSIS — F329 Major depressive disorder, single episode, unspecified: Secondary | ICD-10-CM | POA: Diagnosis present

## 2013-01-25 HISTORY — PX: ROBOT ASSITED LAPAROSCOPIC NEPHROURETERECTOMY: SHX6077

## 2013-01-25 LAB — GLUCOSE, CAPILLARY
Glucose-Capillary: 248 mg/dL — ABNORMAL HIGH (ref 70–99)
Glucose-Capillary: 272 mg/dL — ABNORMAL HIGH (ref 70–99)

## 2013-01-25 LAB — BASIC METABOLIC PANEL
BUN: 16 mg/dL (ref 6–23)
Calcium: 8.9 mg/dL (ref 8.4–10.5)
Chloride: 103 mEq/L (ref 96–112)
Creatinine, Ser: 1.21 mg/dL (ref 0.50–1.35)
GFR calc Af Amer: 63 mL/min — ABNORMAL LOW (ref >90)

## 2013-01-25 SURGERY — ROBOT ASSITED LAPAROSCOPIC NEPHROURETERECTOMY
Anesthesia: General | Laterality: Right | Wound class: Clean Contaminated

## 2013-01-25 MED ORDER — ACETAMINOPHEN 10 MG/ML IV SOLN
INTRAVENOUS | Status: DC | PRN
  Administered 2013-01-25: 1000 mg via INTRAVENOUS

## 2013-01-25 MED ORDER — LIDOCAINE HCL (CARDIAC) 20 MG/ML IV SOLN
INTRAVENOUS | Status: DC | PRN
  Administered 2013-01-25: 100 mg via INTRAVENOUS

## 2013-01-25 MED ORDER — DIPHENHYDRAMINE HCL 12.5 MG/5ML PO ELIX: 12.5000 mg | ORAL_SOLUTION | Freq: Four times a day (QID) | ORAL | Status: DC | PRN

## 2013-01-25 MED ORDER — DEXTROSE-NACL 5-0.45 % IV SOLN
INTRAVENOUS | Status: DC
  Administered 2013-01-25 (×2): via INTRAVENOUS

## 2013-01-25 MED ORDER — VENLAFAXINE HCL ER 150 MG PO TB24: 1.0000 | ORAL_TABLET | Freq: Every day | ORAL | Status: DC

## 2013-01-25 MED ORDER — INSULIN ASPART 100 UNIT/ML ~~LOC~~ SOLN
0.0000 [IU] | SUBCUTANEOUS | Status: DC
  Administered 2013-01-25: 5 [IU] via SUBCUTANEOUS
  Administered 2013-01-25: 8 [IU] via SUBCUTANEOUS
  Administered 2013-01-26: 5 [IU] via SUBCUTANEOUS
  Administered 2013-01-26 – 2013-01-28 (×6): 2 [IU] via SUBCUTANEOUS

## 2013-01-25 MED ORDER — DIPHENHYDRAMINE HCL 50 MG/ML IJ SOLN: 12.5000 mg | Freq: Four times a day (QID) | INTRAMUSCULAR | Status: DC | PRN

## 2013-01-25 MED ORDER — ONDANSETRON HCL 4 MG/2ML IJ SOLN
INTRAMUSCULAR | Status: DC | PRN
  Administered 2013-01-25: 4 mg via INTRAVENOUS

## 2013-01-25 MED ORDER — VANCOMYCIN HCL IN DEXTROSE 1-5 GM/200ML-% IV SOLN
1000.0000 mg | Freq: Two times a day (BID) | INTRAVENOUS | Status: AC
  Administered 2013-01-25: 1000 mg via INTRAVENOUS
  Filled 2013-01-25: qty 200

## 2013-01-25 MED ORDER — DOCUSATE SODIUM 100 MG PO CAPS
100.0000 mg | ORAL_CAPSULE | Freq: Two times a day (BID) | ORAL | Status: DC
  Administered 2013-01-25 – 2013-01-28 (×6): 100 mg via ORAL
  Filled 2013-01-25 (×7): qty 1

## 2013-01-25 MED ORDER — LACTATED RINGERS IR SOLN
Status: DC | PRN
  Administered 2013-01-25: 1000 mL

## 2013-01-25 MED ORDER — FENTANYL CITRATE 0.05 MG/ML IJ SOLN
INTRAMUSCULAR | Status: DC | PRN
  Administered 2013-01-25: 50 ug via INTRAVENOUS
  Administered 2013-01-25 (×4): 25 ug via INTRAVENOUS

## 2013-01-25 MED ORDER — ATORVASTATIN CALCIUM 40 MG PO TABS
40.0000 mg | ORAL_TABLET | Freq: Every evening | ORAL | Status: DC
  Administered 2013-01-25 – 2013-01-27 (×3): 40 mg via ORAL
  Filled 2013-01-25 (×4): qty 1

## 2013-01-25 MED ORDER — ESCITALOPRAM OXALATE 20 MG PO TABS
20.0000 mg | ORAL_TABLET | Freq: Every day | ORAL | Status: DC
  Administered 2013-01-26 – 2013-01-28 (×3): 20 mg via ORAL
  Filled 2013-01-25 (×4): qty 1

## 2013-01-25 MED ORDER — SODIUM CHLORIDE 0.45 % IV SOLN
INTRAVENOUS | Status: DC
  Administered 2013-01-25 – 2013-01-27 (×4): via INTRAVENOUS

## 2013-01-25 MED ORDER — BUPIVACAINE LIPOSOME 1.3 % IJ SUSP
20.0000 mL | Freq: Once | INTRAMUSCULAR | Status: DC
  Filled 2013-01-25: qty 20

## 2013-01-25 MED ORDER — SUCCINYLCHOLINE CHLORIDE 20 MG/ML IJ SOLN
INTRAMUSCULAR | Status: DC | PRN
  Administered 2013-01-25: 100 mg via INTRAVENOUS

## 2013-01-25 MED ORDER — PHENYLEPHRINE HCL 10 MG/ML IJ SOLN
INTRAMUSCULAR | Status: DC | PRN
  Administered 2013-01-25: 40 ug via INTRAVENOUS

## 2013-01-25 MED ORDER — ONDANSETRON HCL 4 MG/2ML IJ SOLN: 4.0000 mg | INTRAMUSCULAR | Status: DC | PRN

## 2013-01-25 MED ORDER — STERILE WATER FOR IRRIGATION IR SOLN
Status: DC | PRN
  Administered 2013-01-25: 3000 mL

## 2013-01-25 MED ORDER — SODIUM CHLORIDE 0.9 % IV SOLN
10.0000 mg | INTRAVENOUS | Status: DC | PRN
  Administered 2013-01-25: 10 ug/min via INTRAVENOUS

## 2013-01-25 MED ORDER — NEOSTIGMINE METHYLSULFATE 1 MG/ML IJ SOLN
INTRAMUSCULAR | Status: DC | PRN
  Administered 2013-01-25: 4 mg via INTRAVENOUS

## 2013-01-25 MED ORDER — LACTATED RINGERS IV SOLN: INTRAVENOUS | Status: DC

## 2013-01-25 MED ORDER — MEPERIDINE HCL 50 MG/ML IJ SOLN: 6.2500 mg | INTRAMUSCULAR | Status: DC | PRN

## 2013-01-25 MED ORDER — HYDROMORPHONE HCL PF 1 MG/ML IJ SOLN
0.5000 mg | INTRAMUSCULAR | Status: DC | PRN
  Administered 2013-01-25: 1 mg via INTRAVENOUS
  Administered 2013-01-25: 0.5 mg via INTRAVENOUS
  Administered 2013-01-26 – 2013-01-27 (×4): 1 mg via INTRAVENOUS
  Filled 2013-01-25 (×6): qty 1

## 2013-01-25 MED ORDER — ZOLPIDEM TARTRATE 5 MG PO TABS: 5.0000 mg | ORAL_TABLET | Freq: Every evening | ORAL | Status: DC | PRN

## 2013-01-25 MED ORDER — ACETAMINOPHEN 10 MG/ML IV SOLN
1000.0000 mg | Freq: Four times a day (QID) | INTRAVENOUS | Status: AC
  Administered 2013-01-26 (×3): 1000 mg via INTRAVENOUS
  Filled 2013-01-25 (×4): qty 100

## 2013-01-25 MED ORDER — LACTATED RINGERS IV BOLUS (SEPSIS)
500.0000 mL | Freq: Once | INTRAVENOUS | Status: AC
  Administered 2013-01-25: 500 mL via INTRAVENOUS

## 2013-01-25 MED ORDER — EPHEDRINE SULFATE 50 MG/ML IJ SOLN
INTRAMUSCULAR | Status: DC | PRN
Start: 2013-01-25 — End: 2013-01-25
  Administered 2013-01-25: 10 mg via INTRAVENOUS
  Administered 2013-01-25: 15 mg via INTRAVENOUS
  Administered 2013-01-25 (×2): 10 mg via INTRAVENOUS

## 2013-01-25 MED ORDER — DEXAMETHASONE SODIUM PHOSPHATE 10 MG/ML IJ SOLN
INTRAMUSCULAR | Status: DC | PRN
  Administered 2013-01-25: 8 mg via INTRAVENOUS

## 2013-01-25 MED ORDER — GLYCOPYRROLATE 0.2 MG/ML IJ SOLN
INTRAMUSCULAR | Status: DC | PRN
  Administered 2013-01-25: 0.6 mg via INTRAVENOUS
  Administered 2013-01-25: 0.3 mg via INTRAVENOUS

## 2013-01-25 MED ORDER — PROPOFOL 10 MG/ML IV BOLUS
INTRAVENOUS | Status: DC | PRN
  Administered 2013-01-25: 150 mg via INTRAVENOUS

## 2013-01-25 MED ORDER — ROCURONIUM BROMIDE 100 MG/10ML IV SOLN
INTRAVENOUS | Status: DC | PRN
  Administered 2013-01-25: 10 mg via INTRAVENOUS
  Administered 2013-01-25: 40 mg via INTRAVENOUS
  Administered 2013-01-25 (×2): 10 mg via INTRAVENOUS

## 2013-01-25 MED ORDER — STERILE WATER FOR IRRIGATION IR SOLN
Status: DC | PRN
  Administered 2013-01-25: 1000 mL

## 2013-01-25 MED ORDER — VANCOMYCIN HCL IN DEXTROSE 1-5 GM/200ML-% IV SOLN
1000.0000 mg | Freq: Once | INTRAVENOUS | Status: AC
  Administered 2013-01-25: 1000 mg via INTRAVENOUS

## 2013-01-25 MED ORDER — PROMETHAZINE HCL 25 MG/ML IJ SOLN: 6.2500 mg | INTRAMUSCULAR | Status: DC | PRN

## 2013-01-25 MED ORDER — LACTATED RINGERS IV SOLN
INTRAVENOUS | Status: DC | PRN
  Administered 2013-01-25 (×3): via INTRAVENOUS

## 2013-01-25 MED ORDER — AMLODIPINE BESYLATE 5 MG PO TABS
5.0000 mg | ORAL_TABLET | Freq: Every morning | ORAL | Status: DC
  Administered 2013-01-26 – 2013-01-28 (×3): 5 mg via ORAL
  Filled 2013-01-25 (×3): qty 1

## 2013-01-25 MED ORDER — FENTANYL CITRATE 0.05 MG/ML IJ SOLN
25.0000 ug | INTRAMUSCULAR | Status: DC | PRN
Start: 2013-01-25 — End: 2013-01-25

## 2013-01-25 MED ORDER — SODIUM CHLORIDE 0.9 % IJ SOLN
INTRAMUSCULAR | Status: DC | PRN
  Administered 2013-01-25: 11:00:00

## 2013-01-25 MED ORDER — OLOPATADINE HCL 0.1 % OP SOLN: 1.0000 [drp] | Freq: Two times a day (BID) | OPHTHALMIC | Status: DC | PRN

## 2013-01-25 MED ORDER — KETOTIFEN FUMARATE 0.025 % OP SOLN: 1.0000 [drp] | Freq: Two times a day (BID) | OPHTHALMIC | Status: DC | PRN

## 2013-01-25 MED ORDER — DOXYCYCLINE HYCLATE 100 MG PO TABS
100.0000 mg | ORAL_TABLET | Freq: Two times a day (BID) | ORAL | Status: DC
  Administered 2013-01-25 – 2013-01-28 (×6): 100 mg via ORAL
  Filled 2013-01-25 (×7): qty 1

## 2013-01-25 SURGICAL SUPPLY — 79 items
ADH SKN CLS APL DERMABOND .7 (GAUZE/BANDAGES/DRESSINGS) ×1
APPLICATOR SURGIFLO ENDO (HEMOSTASIS) IMPLANT
BAG SPEC RTRVL LRG 6X4 10 (ENDOMECHANICALS)
CANNULA SEAL DVNC (CANNULA) ×5 IMPLANT
CANNULA SEALS DA VINCI (CANNULA) ×5
CATH FOLEY 3WAY  5CC 18FR (CATHETERS) ×1
CATH FOLEY 3WAY 5CC 18FR (CATHETERS) ×1 IMPLANT
CHLORAPREP W/TINT 26ML (MISCELLANEOUS) ×2 IMPLANT
CLIP LIGATING HEM O LOK PURPLE (MISCELLANEOUS) ×4 IMPLANT
CLIP LIGATING HEMO O LOK GREEN (MISCELLANEOUS) ×4 IMPLANT
CLIP SUT LAPRA TY ABSORB (SUTURE) IMPLANT
CLOTH BEACON ORANGE TIMEOUT ST (SAFETY) ×2 IMPLANT
CORD HIGH FREQUENCY UNIPOLAR (ELECTROSURGICAL) ×2 IMPLANT
CORDS BIPOLAR (ELECTRODE) ×2 IMPLANT
COVER SURGICAL LIGHT HANDLE (MISCELLANEOUS) ×2 IMPLANT
COVER TIP SHEARS 8 DVNC (MISCELLANEOUS) ×1 IMPLANT
COVER TIP SHEARS 8MM DA VINCI (MISCELLANEOUS) ×1
CUTTER FLEX LINEAR 45M (STAPLE) IMPLANT
DECANTER SPIKE VIAL GLASS SM (MISCELLANEOUS) ×2 IMPLANT
DERMABOND ADVANCED (GAUZE/BANDAGES/DRESSINGS) ×1
DERMABOND ADVANCED .7 DNX12 (GAUZE/BANDAGES/DRESSINGS) ×1 IMPLANT
DRAIN CHANNEL 15F RND FF 3/16 (WOUND CARE) ×2 IMPLANT
DRAPE INCISE IOBAN 66X45 STRL (DRAPES) ×2 IMPLANT
DRAPE LAPAROSCOPIC ABDOMINAL (DRAPES) ×2 IMPLANT
DRAPE LG THREE QUARTER DISP (DRAPES) ×2 IMPLANT
DRAPE TABLE BACK 44X90 PK DISP (DRAPES) ×2 IMPLANT
DRAPE WARM FLUID 44X44 (DRAPE) ×2 IMPLANT
DRESSING SURGICEL FIBRLLR 1X2 (HEMOSTASIS) IMPLANT
DRSG SURGICEL FIBRILLAR 1X2 (HEMOSTASIS)
DRSG TEGADERM 4X4.75 (GAUZE/BANDAGES/DRESSINGS) ×4 IMPLANT
ELECT REM PT RETURN 9FT ADLT (ELECTROSURGICAL) ×2
ELECTRODE REM PT RTRN 9FT ADLT (ELECTROSURGICAL) ×1 IMPLANT
EVACUATOR SILICONE 100CC (DRAIN) ×2 IMPLANT
GAUZE VASELINE 3X9 (GAUZE/BANDAGES/DRESSINGS) IMPLANT
GLOVE BIOGEL M STRL SZ7.5 (GLOVE) ×6 IMPLANT
GOWN STRL NON-REIN LRG LVL3 (GOWN DISPOSABLE) ×6 IMPLANT
HEMOSTAT SURGICEL 4X8 (HEMOSTASIS) IMPLANT
KIT ACCESSORY DA VINCI DISP (KITS) ×1
KIT ACCESSORY DVNC DISP (KITS) ×1 IMPLANT
KIT BASIN OR (CUSTOM PROCEDURE TRAY) ×2 IMPLANT
LUBRICANT JELLY ST 5GR 8946 (MISCELLANEOUS) IMPLANT
MARKER SKIN DUAL TIP RULER LAB (MISCELLANEOUS) IMPLANT
NS IRRIG 1000ML POUR BTL (IV SOLUTION) IMPLANT
PENCIL BUTTON HOLSTER BLD 10FT (ELECTRODE) ×2 IMPLANT
POSITIONER SURGICAL ARM (MISCELLANEOUS) ×2 IMPLANT
POUCH ENDO CATCH II 15MM (MISCELLANEOUS) ×2 IMPLANT
POUCH SPECIMEN RETRIEVAL 10MM (ENDOMECHANICALS) IMPLANT
RELOAD 45 VASCULAR/THIN (ENDOMECHANICALS) IMPLANT
SET CYSTO W/LG BORE CLAMP LF (SET/KITS/TRAYS/PACK) ×2 IMPLANT
SET TUBE IRRIG SUCTION NO TIP (IRRIGATION / IRRIGATOR) IMPLANT
SOLUTION ANTI FOG 6CC (MISCELLANEOUS) ×2 IMPLANT
SOLUTION ELECTROLUBE (MISCELLANEOUS) ×2 IMPLANT
SPONGE LAP 18X18 X RAY DECT (DISPOSABLE) ×2 IMPLANT
SURGIFLO W/THROMBIN 8M KIT (HEMOSTASIS) IMPLANT
SUT ETHILON 3 0 PS 1 (SUTURE) ×4 IMPLANT
SUT MNCRL AB 4-0 PS2 18 (SUTURE) ×4 IMPLANT
SUT MON AB 2-0 SH 27 (SUTURE) IMPLANT
SUT MON AB 2-0 SH27 (SUTURE) IMPLANT
SUT PDS AB 1 CT1 27 (SUTURE) ×4 IMPLANT
SUT PDS AB 1 TP1 96 (SUTURE) ×4 IMPLANT
SUT VIC AB 0 CT1 27 (SUTURE) ×1
SUT VIC AB 0 CT1 27XBRD ANTBC (SUTURE) ×1 IMPLANT
SUT VIC AB 0 UR5 27 (SUTURE) IMPLANT
SUT VIC AB 2-0 SH 27 (SUTURE)
SUT VIC AB 2-0 SH 27X BRD (SUTURE) IMPLANT
SUT VIC AB 4-0 RB1 27 (SUTURE)
SUT VIC AB 4-0 RB1 27XBRD (SUTURE) IMPLANT
SUT VICRYL 0 UR6 27IN ABS (SUTURE) ×4 IMPLANT
SUT VLOC BARB 180 ABS3/0GR12 (SUTURE) ×2
SUTURE VLOC BRB 180 ABS3/0GR12 (SUTURE) ×1 IMPLANT
SYR BULB IRRIGATION 50ML (SYRINGE) IMPLANT
TOWEL OR NON WOVEN STRL DISP B (DISPOSABLE) ×4 IMPLANT
TRAY FOLEY CATH 14FRSI W/METER (CATHETERS) ×2 IMPLANT
TRAY LAP CHOLE (CUSTOM PROCEDURE TRAY) ×2 IMPLANT
TROCAR ENDOPATH XCEL 12X100 BL (ENDOMECHANICALS) ×2 IMPLANT
TROCAR XCEL 12X100 BLDLESS (ENDOMECHANICALS) ×2 IMPLANT
TUBING INSUFFLATION 10FT LAP (TUBING) ×2 IMPLANT
WATER STERILE IRR 1000ML UROMA (IV SOLUTION) IMPLANT
WATER STERILE IRR 1500ML POUR (IV SOLUTION) IMPLANT

## 2013-01-25 NOTE — Interval H&P Note (Signed)
History and Physical Interval Note:  01/25/2013 9:36 AM  Melvin Kent  has presented today for surgery, with the diagnosis of UROTHELIAL CARCINOMA OF RIGHT URETER  The various methods of treatment have been discussed with the patient and family. After consideration of risks, benefits and other options for treatment, the patient has consented to  Procedure(s): ROBOT ASSITED LAPAROSCOPIC NEPHROURETERECTOMY (Right) as a surgical intervention .  The patient's history has been reviewed, patient examined, no change in status, stable for surgery.  I have reviewed the patient's chart and labs.  Questions were answered to the patient's satisfaction.     Sage Kopera,LES

## 2013-01-25 NOTE — Progress Notes (Signed)
Patient ID: Melvin Kent, male   DOB: 1930/03/28, 79 y.o.   MRN: [id number]  Post-op note  Subjective: The patient is doing well.  No complaints.  Objective: Vital signs in last 24 hours: Temp:  [97.5 F (36.4 C)-97.8 F (36.6 C)] 97.7 F (36.5 C) (05/05 1528) Pulse Rate:  [47-78] 50 (05/05 1528) Resp:  [11-18] 13 (05/05 1528) BP: (92-155)/(34-62) 123/62 mmHg (05/05 1528) SpO2:  [97 %-100 %] 97 % (05/05 1528) Weight:  [87.72 kg (193 lb 6.2 oz)] 87.72 kg (193 lb 6.2 oz) (05/05 1528)  Intake/Output from previous day:   Intake/Output this shift: Total I/O In: 2000 [I.V.:2000] Out: 385 [Urine:325; Drains:10; Blood:50]  Physical Exam:  General: Alert and oriented. Abdomen: Soft, Nondistended. Incisions: Clean and dry. GU: Urine is clear.  Lab Results:  Recent Labs  01/25/13 1443  HGB 11.8*  HCT 36.5*    Assessment/Plan: POD#0   1) Continue to monitor   Moody Bruins. MD   LOS: 0 days   Phuong Moffatt,LES 01/25/2013, 6:01 PM

## 2013-01-25 NOTE — Anesthesia Preprocedure Evaluation (Signed)
Anesthesia Evaluation  Patient identified by MRN, date of birth, ID band Patient awake    Reviewed: Allergy & Precautions, H&P , NPO status , Patient's Chart, lab work & pertinent test results  Airway Mallampati: II TM Distance: >3 FB Neck ROM: Full    Dental no notable dental hx.    Pulmonary neg pulmonary ROS, sleep apnea ,  breath sounds clear to auscultation  Pulmonary exam normal       Cardiovascular hypertension, Pt. on medications Rhythm:Regular Rate:Normal     Neuro/Psych negative neurological ROS  negative psych ROS   GI/Hepatic negative GI ROS, Neg liver ROS,   Endo/Other  negative endocrine ROSneg diabetes  Renal/GU negative Renal ROS  negative genitourinary   Musculoskeletal negative musculoskeletal ROS (+)   Abdominal   Peds negative pediatric ROS (+)  Hematology negative hematology ROS (+)   Anesthesia Other Findings   Reproductive/Obstetrics negative OB ROS                           Anesthesia Physical  Anesthesia Plan  ASA: II  Anesthesia Plan: General   Post-op Pain Management:    Induction: Intravenous  Airway Management Planned: Oral ETT  Additional Equipment:   Intra-op Plan:   Post-operative Plan: Extubation in OR  Informed Consent: I have reviewed the patients History and Physical, chart, labs and discussed the procedure including the risks, benefits and alternatives for the proposed anesthesia with the patient or authorized representative who has indicated his/her understanding and acceptance.   Dental advisory given  Plan Discussed with: CRNA  Anesthesia Plan Comments:         Anesthesia Quick Evaluation                                   Anesthesia Evaluation  Patient identified by MRN, date of birth, ID band Patient awake    Reviewed: Allergy & Precautions, H&P , NPO status , Patient's Chart, lab work & pertinent test  results  Airway Mallampati: II TM Distance: >3 FB Neck ROM: full    Dental  (+) Edentulous Upper, Missing and Dental Advisory Given Missing many lower teeth too:   Pulmonary neg pulmonary ROS, shortness of breath and with exertion,  breath sounds clear to auscultation  Pulmonary exam normal       Cardiovascular hypertension, Pt. on medications + DOE negative cardio ROS  Rhythm:regular Rate:Normal     Neuro/Psych Depression 80% Carotid artery occlusion negative neurological ROS  negative psych ROS   GI/Hepatic negative GI ROS, Neg liver ROS,   Endo/Other  negative endocrine ROS  Renal/GU negative Renal ROS  negative genitourinary   Musculoskeletal   Abdominal   Peds  Hematology negative hematology ROS (+)   Anesthesia Other Findings   Reproductive/Obstetrics negative OB ROS                          Anesthesia Physical Anesthesia Plan  ASA: III  Anesthesia Plan: General   Post-op Pain Management:    Induction: Intravenous  Airway Management Planned: LMA  Additional Equipment:   Intra-op Plan:   Post-operative Plan:   Informed Consent: I have reviewed the patients History and Physical, chart, labs and discussed the procedure including the risks, benefits and alternatives for the proposed anesthesia with the patient or authorized representative who has indicated his/her understanding and acceptance.  Dental Advisory Given  Plan Discussed with: CRNA and Surgeon  Anesthesia Plan Comments:         Anesthesia Quick Evaluation

## 2013-01-25 NOTE — Transfer of Care (Signed)
Immediate Anesthesia Transfer of Care Note  Patient: Melvin Kent  Procedure(s) Performed: Procedure(s): ROBOT ASSITED LAPAROSCOPIC NEPHROURETERECTOMY (Right)  Patient Location: PACU  Anesthesia Type:General  Level of Consciousness: sedated  Airway & Oxygen Therapy: Patient Spontanous Breathing and Patient connected to face mask oxygen  Post-op Assessment: Report given to PACU RN and Post -op Vital signs reviewed and stable  Post vital signs: Reviewed and stable  Complications: No apparent anesthesia complications

## 2013-01-25 NOTE — Anesthesia Postprocedure Evaluation (Signed)
  Anesthesia Post-op Note  Patient: Melvin Kent  Procedure(s) Performed: Procedure(s) (LRB): ROBOT ASSITED LAPAROSCOPIC NEPHROURETERECTOMY (Right)  Patient Location: PACU  Anesthesia Type: General  Level of Consciousness: awake and alert   Airway and Oxygen Therapy: Patient Spontanous Breathing  Post-op Pain: mild  Post-op Assessment: Post-op Vital signs reviewed, Patient's Cardiovascular Status Stable, Respiratory Function Stable, Patent Airway and No signs of Nausea or vomiting  Last Vitals:  Filed Vitals:   01/25/13 1528  BP: 123/62  Pulse:   Temp:   Resp: 13    Post-op Vital Signs: stable   Complications: No apparent anesthesia complications

## 2013-01-25 NOTE — Op Note (Signed)
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 Right robotic-assisted laparoscopic nephroureterectomy  Surgeon: Moody Bruins. M.D.   Assistant(s): Pecola Leisure, PA-C  Anesthesia: General   Complications: None  EBL: 50 cc  IVF: 2000 mL crystalloid   Specimens:  1. Right kidney and proximal ureter 2. Distal right ureter and bladder cuff  Disposition of specimens: Pathology   Drains:  1. # 15 Blake pelvic drain 2. 18 Fr Foley catheter  Indication:   Melvin Kent is a 79 y.o. 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 After a thorough review of the management options, he elected to proceed with surgical treatment and the above procedure. We have discussed the potential benefits and risks of the procedure, side effects of the proposed treatment, the likelihood of the patient achieving the goals of the procedure, and any potential problems that might occur during the procedure or recuperation. Informed consent has been obtained.   Description of procedure:   The patient was taken to the operating room and a general anesthetic was administered. The patient was given preoperative antibiotics, placed in the right modified flank position with care to pad all potential pressure points, and prepped and draped in the usual sterile fashion. Next a preoperative timeout was performed.   A site was selected on the right side of the umbilicus for placement of the camera port. This was placed using a standard open Hassan technique which allowed entry into the peritoneal cavity under direct vision and without difficulty. A 12 mm port was placed and a pneumoperitoneum established. The camera was then used to inspect the abdomen and there was no evidence of any intra-abdominal injuries or other abnormalities. The remaining abdominal ports were then placed. 8 mm  robotic ports were placed in the right upper quadrant, right lower quadrant, and far right lateral abdominal wall. A 12 mm port was placed in the upper midline for laparoscopic assistance. All ports were placed under direct vision without difficulty.   Utilizing the cautery scissors, the white line of Toldt was incised allowing the colon to be mobilized medially and the plane between the mesocolon and the anterior layer of Gerota's fascia to be developed and the kidney to be exposed. The ureter and gonadal vein were identified inferiorly and the ureter was lifted anteriorly off the psoas muscle. Dissection proceeded superiorly along the gonadal vein until it entered the inferior vena cava. The gonadal vein was divided after ligation with multiple Weck clips. The renal hilum was then encountered. There was a single renal artery and two renal veins.  The renal artery was first ligated with multiple Weck clips and divided.  The renal veins were then ligated with multiple Weck clips and divided.  Gerota's fascia was then intentionally entered superiorly to spare the adrenal gland and the hepatorenal ligaments were divided. The lateral attachments to the kidney were then divided allowing the kidney to be freely mobile. Dissection then proceeded inferiorly along the ureter toward the pelvis. The gonadal vein had been ligated superiorly at its insertion into the IVC and was again ligated with Weck clips distally and divided. The ureter was dissected free down the the common iliac vessels.   At this point attention returned to the renal fossa. Hemostasis was ensured.  Attention then focused on the pelvic dissection. The robotic cart was undocked. An additional 8 mm robotic port was placed in the lower midline and  the cart was redocked over the patient's hip. The ureter was further dissected freely into the pelvis after the overlying peritoneum was incised and the small vessels feeding the ureter was ligated with bipolar  energy. A Weck clip was placed on the ureter distally to avoid any risk of tumor spillage. The detrusor muscle fibers were divided and the mucosa could be visualized. A 3-0 V-lock suture was secured at the lateral side of the margin of resection in the bladder. The mucosa was then entered and the ureter and a surrounding bladder cuff were removed. The cystotomy was then closed with the v-lock suture in a running fashion with a second imbricating layer as well. The bladder was then filled with saline and there was no evidence for urine leak.   A # 15 Blake drain was then brought through the lateral lower port site and positioned in the perivesical space. The specimen was placed in an Endocatch II bag and later removed through a lower midline incision extending from the robotic port site in that area. The 12 mm upper midline incision was closed with a 0-vicryl suture laparoscopically and the camera port site was closed with a figure of eight 0- vicryl suture. The lower midline extraction site was closed with two running # 1 PDS sutures. All other laparoscopic/robotic ports were removed under direct vision and the pneumoperitoneum let down with inspection of the operative field performed and hemostasis again confirmed. All incision sites were then injected with local anesthetic and reapproximated at the skin level with 4-0 monocryl subcuticular closures. Dermabond was applied to the skin. The patient tolerated the procedure well and without complications. The patient was able to be extubated and transferred to the recovery unit in satisfactory condition.    Moody Bruins MD

## 2013-01-26 ENCOUNTER — Encounter (HOSPITAL_COMMUNITY): Payer: Self-pay | Admitting: Urology

## 2013-01-26 LAB — BASIC METABOLIC PANEL
BUN: 17 mg/dL (ref 6–23)
CO2: 26 mEq/L (ref 19–32)
Chloride: 101 mEq/L (ref 96–112)
Creatinine, Ser: 1.24 mg/dL (ref 0.50–1.35)

## 2013-01-26 LAB — HEMOGLOBIN AND HEMATOCRIT, BLOOD: Hemoglobin: 10.6 g/dL — ABNORMAL LOW (ref 13.0–17.0)

## 2013-01-26 LAB — GLUCOSE, CAPILLARY
Glucose-Capillary: 233 mg/dL — ABNORMAL HIGH (ref 70–99)
Glucose-Capillary: 98 mg/dL (ref 70–99)

## 2013-01-26 MED ORDER — BISACODYL 10 MG RE SUPP
10.0000 mg | Freq: Once | RECTAL | Status: AC
  Administered 2013-01-26: 10 mg via RECTAL
  Filled 2013-01-26: qty 1

## 2013-01-26 NOTE — Progress Notes (Signed)
Nutrition Brief Note  Patient identified on the Malnutrition Screening Tool (MST) Report  Body mass index is 28.55 kg/(m^2). Patient meets criteria for Overweight based on current BMI. Pt reports that he is at his usual body weight of 193 lbs.   Current diet order is Carb Modified, patient is consuming approximately 100% of meals at this time. Pt reports that his appetite is good and he was eating well PTA. Pt has no questions or concerns at this time. Labs and medications reviewed.   No nutrition interventions warranted at this time. If nutrition issues arise, please consult RD.   Ian Malkin RD, LDN Inpatient Clinical Dietitian Pager: [phone] After Hours Pager: [phone]

## 2013-01-26 NOTE — Progress Notes (Signed)
Patient ID: Melvin Kent, male   DOB: October 25, 1929, 79 y.o.   MRN: [id number]  1 Day Post-Op Subjective: Pt without complaints.  Some nausea but now resolved. Pain controlled.  Objective: Vital signs in last 24 hours: Temp:  [97.5 F (36.4 C)-99.2 F (37.3 C)] 99.2 F (37.3 C) (05/06 0433) Pulse Rate:  [47-85] 78 (05/06 0433) Resp:  [11-18] 14 (05/06 0433) BP: (92-155)/(34-81) 120/81 mmHg (05/06 0433) SpO2:  [93 %-100 %] 97 % (05/06 0433) Weight:  [87.72 kg (193 lb 6.2 oz)] 87.72 kg (193 lb 6.2 oz) (05/05 1528)  Intake/Output from previous day: 05/05 0701 - 05/06 0700 In: 3485.4 [P.O.:250; I.V.:2935.4; IV Piggyback:300] Out: 1450 [Urine:1350; Drains:50; Blood:50] Intake/Output this shift:    Physical Exam:  General: Alert and oriented CV: RRR Lungs: Clear Abdomen: Soft, ND Incisions: C/D/I GU: Urine clear Ext: NT, No erythema  Lab Results:  Recent Labs  01/25/13 1443 01/26/13 0527  HGB 11.8* 10.6*  HCT 36.5* 34.1*   BMET  Recent Labs  01/25/13 1443 01/26/13 0527  NA 140 138  K 3.5 4.0  CL 103 101  CO2 25 26  GLUCOSE 195* 119*  BUN 16 17  CREATININE 1.21 1.24  CALCIUM 8.9 8.7     Studies/Results: No results found.  Assessment/Plan: POD # 1 s/p right robotic nephroureterectomy - Ambulate, IS - Advance diet - Dulcolax suppository - Continue urethral catheter and pelvic drain - Path pending   LOS: 1 day   Zorawar Strollo,LES 01/26/2013, 7:08 AM

## 2013-01-26 NOTE — Progress Notes (Signed)
Patient is resting without issues. At 2115--pt up ambulating in the hall felt a little dizzy---VSs, daughter along side with patient. Pt placed in wheel chair and  back to bed. Pt up later and did fine. Tolerating clear liquids. CBG 272-- called Marchelle Folks and asked for change in IV fluids.. Changed fluids to 1/2 NS at the same rate---midnight CBG 233.. Will cont to monitor throughout the night. Tama Gander, RN, BSN

## 2013-01-26 NOTE — Care Management Note (Addendum)
    Page 1 of 1   01/28/2013     5:57:38 PM   CARE MANAGEMENT NOTE 01/28/2013  Patient:  Melvin Kent, Melvin Kent   Account Number:  [id number]  Date Initiated:  01/26/2013  Documentation initiated by:  Lanier Clam  Subjective/Objective Assessment:   ADMITTED W/PROSTATE CA.     Action/Plan:   FROM HOME.HAS PCP,PHARMACY.   Anticipated DC Date:  01/28/2013   Anticipated DC Plan:  HOME/SELF CARE      DC Planning Services  CM consult      Choice offered to / List presented to:             Status of service:  Completed, signed off Medicare Important Message given?   (If response is "NO", the following Medicare IM given date fields will be blank) Date Medicare IM given:   Date Additional Medicare IM given:    Discharge Disposition:  HOME/SELF CARE  Per UR Regulation:  Reviewed for med. necessity/level of care/duration of stay  If discussed at Long Length of Stay Meetings, dates discussed:    Comments:  01/26/13 Alexandros Ewan RN,BSN NCM [phone] S/P R LAP NEPHROUTERECTOMY.

## 2013-01-27 LAB — GLUCOSE, CAPILLARY
Glucose-Capillary: 117 mg/dL — ABNORMAL HIGH (ref 70–99)
Glucose-Capillary: 125 mg/dL — ABNORMAL HIGH (ref 70–99)
Glucose-Capillary: 128 mg/dL — ABNORMAL HIGH (ref 70–99)

## 2013-01-27 LAB — BASIC METABOLIC PANEL
CO2: 24 mEq/L (ref 19–32)
Calcium: 8.2 mg/dL — ABNORMAL LOW (ref 8.4–10.5)
Creatinine, Ser: 1.29 mg/dL (ref 0.50–1.35)
Glucose, Bld: 125 mg/dL — ABNORMAL HIGH (ref 70–99)

## 2013-01-27 LAB — CREATININE, FLUID (PLEURAL, PERITONEAL, JP DRAINAGE): Creat, Fluid: 1.4 mg/dL

## 2013-01-27 LAB — HEMOGLOBIN AND HEMATOCRIT, BLOOD: HCT: 32.7 % — ABNORMAL LOW (ref 39.0–52.0)

## 2013-01-27 MED ORDER — HYDROCODONE-ACETAMINOPHEN 5-325 MG PO TABS
1.0000 | ORAL_TABLET | Freq: Four times a day (QID) | ORAL | Status: DC | PRN
  Administered 2013-01-27 (×2): 1 via ORAL
  Filled 2013-01-27 (×2): qty 1

## 2013-01-27 NOTE — Progress Notes (Addendum)
Patient ID: Melvin Kent, male   DOB: [birth year], 79 y.o.   MRN: [id number]  2 Days Post-Op Subjective: Pt doing well.  Still with some nausea but passing flatus and tolerating diet.  He has been ambulating without problems.  Objective: Vital signs in last 24 hours: Temp:  [98.2 F (36.8 C)-98.3 F (36.8 C)] 98.2 F (36.8 C) (05/07 0524) Pulse Rate:  [72-81] 78 (05/07 0524) Resp:  [12-16] 12 (05/07 0524) BP: (107-140)/(49-75) 140/75 mmHg (05/07 0524) SpO2:  [93 %-99 %] 96 % (05/07 0524)  Intake/Output from previous day: 05/06 0701 - 05/07 0700 In: 3565 [P.O.:840; I.V.:2625; IV Piggyback:100] Out: 3155 [Urine:3000; Drains:155] Intake/Output this shift: Total I/O In: 1427.1 [I.V.:1427.1] Out: 2020 [Urine:1950; Drains:70]  Physical Exam:  General: Alert and oriented CV: RRR Lungs: Clear Abdomen: Soft, ND, positive BS Incisions: C/D/I Ext: NT, No erythema  Lab Results:  Recent Labs  01/25/13 1443 01/26/13 0527 01/27/13 0535  HGB 11.8* 10.6* 10.4*  HCT 36.5* 34.1* 32.7*   BMET  Recent Labs  01/26/13 0527 01/27/13 0535  NA 138 132*  K 4.0 3.8  CL 101 99  CO2 26 24  GLUCOSE 119* 125*  BUN 17 21  CREATININE 1.24 1.29  CALCIUM 8.7 8.2*     Studies/Results: Pathology pending.  Assessment/Plan: POD # 2 s/p right robotic nephroureterectomy - Ambulate, IS - Decrease IVF - Check drain Cr - Po pain meds - Continue urethral catheter and will consider cystogram tomorrow   LOS: 2 days   Alphonsa Brickle,LES 01/27/2013, 6:55 AM

## 2013-01-28 ENCOUNTER — Inpatient Hospital Stay (HOSPITAL_COMMUNITY): Payer: Medicare Other

## 2013-01-28 LAB — GLUCOSE, CAPILLARY
Glucose-Capillary: 139 mg/dL — ABNORMAL HIGH (ref 70–99)
Glucose-Capillary: 116 mg/dL — ABNORMAL HIGH (ref 70–99)
Glucose-Capillary: 129 mg/dL — ABNORMAL HIGH (ref 70–99)
Glucose-Capillary: 112 mg/dL — ABNORMAL HIGH (ref 70–99)

## 2013-01-28 MED ORDER — DIATRIZOATE MEGLUMINE 30 % UR SOLN
Freq: Once | URETHRAL | Status: AC | PRN
  Administered 2013-01-28: 75 mL

## 2013-01-28 MED ORDER — HYDROCODONE-ACETAMINOPHEN 5-325 MG PO TABS: 1.0000 | ORAL_TABLET | Freq: Four times a day (QID) | ORAL | Status: DC | PRN

## 2013-01-28 NOTE — Discharge Summary (Signed)
Date of admission: 01/25/2013  Date of discharge: 01/28/2013  Admission diagnosis: Right urothelial carcinoma  Discharge diagnosis: same  Secondary diagnoses: Prostate cancer, depression, arthritis, DM, hypercholesterolemia, HTN  History and Physical: For full details, please see admission history and physical. Briefly, Melvin Kent is a 79 y.o. year old patient with Urothelial carcinoma: He was noted to have persistent microscopic hematuria in February 2014. He had denied gross hematuria and has no history of tobacco use or family history of GU malignancy. He underwent an evaluation including a CT scan of the abdomen and pelvis and was noted to have a right ureteral mass prompting ureteroscopy and biopsies. His pathology initially indicated a low grade urothelial carcinoma after undergoing biopsies of this mass endoscopically. We discussed treatment options and he elected to proceed with endoscopic laser ablation. This was performed in April 2014 and his tumor was completely ablated. However, his pathology from this procedure indicated abundant high grade urothelial carcinoma.  He followed up after undergoing holmium laser ablation of his large volume right ureteral tumor. He tolerated this procedure well. Unfortunately, his biopsy results revealed abundant high grade urothelial carcinoma which was different from his initial pathology result which suggested only low-grade disease.   Hospital Course:  Pt was admitted and taken to the OR on 01/25/13 for robotic assisted laparoscopic right nephroureterectomy.  Pt tolerated the procedure well and was hemodynamically stable immediately post op.  He was extubated without complication and woke up from anesthesia neurologically intact.  He was transferred from the OR to the PACU and then to the floor without difficulty.  His post op course progressed as expected.  His vitals have remained stable and he has been ambulating.  He is tolerating a regular diet and has  had flatus but no BM.  His drain Cr was 1.4 and the drain was removed on POD 3.  A cystogram was performed on POD 3 and this did reveal a leak. The foley cath was kept in place.  The pt is doing well and felt stable for d/c home on POD 3 with foley in place.    Laboratory values:  Recent Labs  01/25/13 1443 01/26/13 0527 01/27/13 0535  HGB 11.8* 10.6* 10.4*  HCT 36.5* 34.1* 32.7*    Disposition: Home  Discharge instruction: The patient was instructed to be ambulatory but to refrain from heavy lifting, strenuous activity, or driving.   Discharge medications:     Medication List    STOP taking these medications       aspirin EC 81 MG tablet     CALTRATE 600 PLUS-VIT D PO     Cinnamon 500 MG capsule     clindamycin 150 MG capsule  Commonly known as:  CLEOCIN     doxycycline 50 MG capsule  Commonly known as:  VIBRAMYCIN     GARLIC PO     Pamine FQ 5 MG Kit     PRESERVISION AREDS 2 Caps      TAKE these medications       amLODipine 5 MG tablet  Commonly known as:  NORVASC  Take 5 mg by mouth every morning.     atorvastatin 40 MG tablet  Commonly known as:  LIPITOR  Take 40 mg by mouth every evening.     escitalopram 20 MG tablet  Commonly known as:  LEXAPRO  Take 20 mg by mouth daily. TAKES ONE EVERY AM     fexofenadine 180 MG tablet  Commonly known as:  ALLEGRA  Take  180 mg by mouth daily.     HYDROcodone-acetaminophen 5-325 MG per tablet  Commonly known as:  NORCO/VICODIN  Take 1-2 tablets by mouth every 6 (six) hours as needed.     ketotifen 0.025 % ophthalmic solution  Commonly known as:  ZADITOR  Place 1 drop into both eyes 2 (two) times daily as needed (for itching).     telmisartan-hydrochlorothiazide 80-25 MG per tablet  Commonly known as:  MICARDIS HCT  Take 0.5 tablets by mouth daily before breakfast.         Followup:  Follow-up Information   Follow up with Crecencio Mc, MD On 02/09/2013. (at 10:30)    Contact information:   [address] [place] [address] [phone]

## 2013-01-28 NOTE — Progress Notes (Signed)
Patient ID: Melvin Kent, male   DOB: 11-02-1929, 79 y.o.   MRN: [id number]  3 Days Post-Op Subjective: Pt doing well.  No nausea overnight.  Tolerating diet.  Ambulating well.  Objective: Vital signs in last 24 hours: Temp:  [98 F (36.7 C)-100.8 F (38.2 C)] 98.2 F (36.8 C) (05/08 0426) Pulse Rate:  [68-86] 68 (05/08 0426) Resp:  [18] 18 (05/08 0426) BP: (134-147)/(57-77) 147/77 mmHg (05/08 0426) SpO2:  [96 %-97 %] 97 % (05/08 0426)  Intake/Output from previous day: 05/07 0701 - 05/08 0700 In: 1563.3 [P.O.:840; I.V.:723.3] Out: 3765 [Urine:3650; Drains:115] Intake/Output this shift:    Physical Exam:  General: Alert and oriented CV: RRR Lungs: Clear Abdomen: Soft, ND, positive BS Incisions: C/D/I Ext: NT, No erythema  Lab Results:  Recent Labs  01/25/13 1443 01/26/13 0527 01/27/13 0535  HGB 11.8* 10.6* 10.4*  HCT 36.5* 34.1* 32.7*   BMET  Recent Labs  01/26/13 0527 01/27/13 0535  NA 138 132*  K 4.0 3.8  CL 101 99  CO2 26 24  GLUCOSE 119* 125*  BUN 17 21  CREATININE 1.24 1.29  CALCIUM 8.7 8.2*     Studies/Results: - Pathology pending - Drain Cr 1.4  Assessment/Plan: - Cystogram to rule out urine leak - Will D/C drain - D/C home likely later today   LOS: 3 days   Dorena Dorfman,LES 01/28/2013, 7:18 AM

## 2013-01-28 NOTE — Progress Notes (Signed)
Pt d/c with foley catheter intact. Catheter care complete. Pt and pt wife verbalized understanding of catheter care.

## 2013-04-28 ENCOUNTER — Other Ambulatory Visit: Payer: Self-pay

## 2013-07-29 ENCOUNTER — Other Ambulatory Visit: Payer: Self-pay

## 2013-09-23 HISTORY — PX: MOHS SURGERY: SUR867

## 2013-10-06 ENCOUNTER — Other Ambulatory Visit (HOSPITAL_COMMUNITY): Payer: Self-pay | Admitting: Urology

## 2013-10-06 ENCOUNTER — Ambulatory Visit (HOSPITAL_COMMUNITY)
Admission: RE | Admit: 2013-10-06 | Discharge: 2013-10-06 | Disposition: A | Discharge status: Home / Self Care | Payer: Medicare Other | Source: Ambulatory Visit | Attending: Urology | Admitting: Urology

## 2013-10-06 DIAGNOSIS — J4489 Other specified chronic obstructive pulmonary disease: Secondary | ICD-10-CM | POA: Insufficient documentation

## 2013-10-06 DIAGNOSIS — J449 Chronic obstructive pulmonary disease, unspecified: Secondary | ICD-10-CM | POA: Insufficient documentation

## 2013-10-06 DIAGNOSIS — C669 Malignant neoplasm of unspecified ureter: Secondary | ICD-10-CM

## 2013-10-06 DIAGNOSIS — C679 Malignant neoplasm of bladder, unspecified: Secondary | ICD-10-CM | POA: Insufficient documentation

## 2013-10-08 ENCOUNTER — Other Ambulatory Visit: Payer: Self-pay | Admitting: Urology

## 2013-10-08 NOTE — Progress Notes (Signed)
Surgery on 10/14/13.  Need orders in EPIC.  Thank You.

## 2013-10-12 ENCOUNTER — Encounter (HOSPITAL_COMMUNITY): Payer: Self-pay

## 2013-10-12 ENCOUNTER — Encounter (HOSPITAL_COMMUNITY): Payer: Self-pay | Admitting: Pharmacy Technician

## 2013-10-12 ENCOUNTER — Encounter (HOSPITAL_COMMUNITY)
Admission: RE | Admit: 2013-10-12 | Discharge: 2013-10-12 | Disposition: A | Discharge status: Home / Self Care | Payer: Medicare Other | Source: Ambulatory Visit | Attending: Urology | Admitting: Urology

## 2013-10-12 HISTORY — DX: Allergy status to unspecified drugs, medicaments and biological substances: Z88.9

## 2013-10-12 HISTORY — DX: Other specified personal risk factors, not elsewhere classified: Z91.89

## 2013-10-12 LAB — BASIC METABOLIC PANEL
BUN: 27 mg/dL — ABNORMAL HIGH (ref 6–23)
CALCIUM: 9.6 mg/dL (ref 8.4–10.5)
CO2: 24 meq/L (ref 19–32)
CREATININE: 1.17 mg/dL (ref 0.50–1.35)
Chloride: 100 mEq/L (ref 96–112)
GFR calc Af Amer: 66 mL/min — ABNORMAL LOW (ref >90)
GFR calc non Af Amer: 57 mL/min — ABNORMAL LOW (ref >90)
GLUCOSE: 109 mg/dL — AB (ref 70–99)
Potassium: 4.4 mEq/L (ref 3.7–5.3)
Sodium: 141 mEq/L (ref 137–147)

## 2013-10-12 LAB — CBC
HEMATOCRIT: 42.8 % (ref 39.0–52.0)
HEMOGLOBIN: 14.4 g/dL (ref 13.0–17.0)
MCH: 29 pg (ref 26.0–34.0)
MCHC: 33.6 g/dL (ref 30.0–36.0)
MCV: 86.1 fL (ref 78.0–100.0)
Platelets: 229 10*3/uL (ref 150–400)
RBC: 4.97 MIL/uL (ref 4.22–5.81)
RDW: 14.8 % (ref 11.5–15.5)
WBC: 10.8 10*3/uL — ABNORMAL HIGH (ref 4.0–10.5)

## 2013-10-12 LAB — SURGICAL PCR SCREEN
MRSA, PCR: POSITIVE — AB
Staphylococcus aureus: POSITIVE — AB

## 2013-10-12 NOTE — Progress Notes (Signed)
10/12/13 1500  OBSTRUCTIVE SLEEP APNEA  Have you ever been diagnosed with sleep apnea through a sleep study? No  Do you snore loudly (loud enough to be heard through closed doors)?  1  Do you often feel tired, fatigued, or sleepy during the daytime? 1  Has anyone observed you stop breathing during your sleep? 0  Do you have, or are you being treated for high blood pressure? 1  BMI more than 35 kg/m2? 0  Age over 78 years old? 1  Neck circumference greater than 40 cm/18 inches? 0  Gender: 1  Obstructive Sleep Apnea Score 5  Score 4 or greater  Results sent to PCP

## 2013-10-12 NOTE — Progress Notes (Signed)
Chest 1/15, EKG 3/14 EPIC

## 2013-10-12 NOTE — Patient Instructions (Addendum)
[facility]  10/12/2013   Your procedure is scheduled on:  10/14/13 THURSDAY  Report to [facility] at  0830    AM.  Call this number if you have problems the morning of surgery: [phone]       Remember:   Do not eat food  Or drink :After Midnight. Wednesday NIGHT   Take these medicines the morning of surgery with A SIP OF WATER: Amlodipine, Allerga, Effexor   .  Contacts, dentures or partial plates can not be worn to surgery  Leave suitcase in the car. After surgery it may be brought to your room.  For patients admitted to the hospital, checkout time is 11:00 AM day of  discharge.             SPECIAL INSTRUCTIONS- SEE [facility] PREPARING FOR SURGERY INSTRUCTION SHEET-     DO NOT WEAR JEWELRY, LOTIONS, POWDERS, OR PERFUMES.  WOMEN-- DO NOT SHAVE LEGS OR UNDERARMS FOR 12 HOURS BEFORE SHOWERS. MEN MAY SHAVE FACE.  Patients discharged the day of surgery will not be allowed to drive home. IF going home the day of surgery, you must have a driver and someone to stay with you for the first 24 hours  Name and phone number of your driver:  Juliet Rude  PST [phone]                 FAILURE TO [facility]                                                  Patient Signature _____________________________

## 2013-10-12 NOTE — Progress Notes (Signed)
Faxed BMET to Dr Alinda Money via [facility]

## 2013-10-13 NOTE — H&P (Signed)
History of Present Illness Mr. Biederman is an 80 year old with the following urologic history:    1) Prostate cancer: He was diagnosed in 2005 and underwent treatment with IMRT (76 Gy) and 2 years of Lupron. His PSA has been undetectable.    TNM stage: cT2c N0 M0  Gleason score: 4+5=9  Pretreatment PSA: 7.3    2) Erectile dysfunction: He has a history of a decreased libido and has previously been given sample for Viagra which he has not previously tried.     3) Osteopenia: He does have a history of osteopenia on prior bone density studies, the last of which was performed in 06/2006. He has been on vitamin D and calcium supplementation and has been engaging in regular exercise at the [facility].    4) Urothelial carcinoma: He was noted to have persistent microscopic hematuria in February 2014. He had denied gross hematuria and has no history of tobacco use or family history of GU malignancy. He underwent an evaluation including a CT scan of the abdomen and pelvis and was noted to have a right ureteral mass prompting ureteroscopy and biopsies. His pathology initially indicated a low grade urothelial carcinoma after undergoing biopsies of this mass endoscopically. We discussed treatment options and he elected to proceed with endoscopic laser ablation. This was performed in April 2014 and his tumor was completely ablated. However, his pathology from this procedure indicated abundant high grade urothelial carcinoma. Based on the finding of high grade disease, he elected to undergo a right RAL nephroureterectomy on 01/25/13.    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 He denies any hematuria or change in voiding habits over the past few months. He also follows up today for surveillance of his prostate cancer.     Past Medical History Problems  1. History  of Arthritis (V13.4) 2. History of Exposed To [place] - Dioxin 3. History of depression (V11.8) 4. History of diabetes mellitus (V12.29) 5. History of hypercholesterolemia (V12.29) 6. History of hypertension (V12.59) 7. Personal history of prostate cancer (V10.46) 8. History of Thrombophlebitis (V12.52)  Surgical History Problems  1. History of Arthroscopy Knee 2. History of Cystoscopy For Urethral Stricture 3. History of Cystoscopy With Insertion Of Ureteral Stent Right 4. History of Cystoscopy With Insertion Of Ureteral Stent Right 5. History of Cystoscopy With Resection Of Tumor 6. History of Cystoscopy With Ureteroscopy For Biopsy Right 7. History of Inguinal Hernia Repair 8. History of Kidney Surgery Laparoscopically Assisted Nephroureterectomy 9. History of Nose Surgery 10. History of Partial Colectomy 11. History of Surgery Of Male Genitalia Vasectomy  Current Meds 1. Allegra 180 MG TABS;  Therapy: (Recorded:02May2008) to Recorded 2. Aspir-81 81 MG Oral Tablet Delayed Release;  Therapy: (Recorded:02May2008) to Recorded 3. Benicar TABS;  Therapy: (Recorded:19Nov2010) to Recorded 4. Caltrate 600 TABS;  Therapy: (Recorded:02May2008) to Recorded 5. Cinnamon 500 MG Oral Capsule;  Therapy: (Recorded:02May2008) to Recorded 6. Crestor TABS;  Therapy: (Recorded:19May2010) to Recorded 7. FiberCon TABS;  Therapy: (Recorded:02May2008) to Recorded 8. Fish Oil CAPS;  Therapy: (Recorded:02May2008) to Recorded 9. Garlic CAPS;  Therapy: (Recorded:02May2008) to Recorded 10. ICaps MV TABS;   Therapy: (Recorded:02May2008) to Recorded 11. Multivitamins TABS;   Therapy: (Recorded:02May2008) to Recorded 12. Pamine Forte TABS;   Therapy: (Recorded:02May2008) to Recorded  Allergies Medication  1. Penicillins  Family History Problems  1. Family history of Colon Cancer (V16.0) :  Son 2. Family history of Hypercholesterolemia : Daughter 3. Family history of  Hypertension (V17.49) : Daughter  Social History Problems  1. Alcohol Use   rarely 2. Marital History - Currently Married 3. Occupation:   retired from [employer] and 23 years of service in the [employer] 4. Denied: Tobacco Use  Vitals Vital Signs [Data Includes: Last 1 Day]  Recorded: [id number] 03:14PM  Height: 5 ft 8.5 in Weight: 202 lb  BMI Calculated: 30.27 BSA Calculated: 2.06 Blood Pressure: 152 / 74 Temperature: 97.6 F Heart Rate: 58  Physical Exam Constitutional: Well nourished and well developed . No acute distress.  Neck: The appearance of the neck is normal and no neck mass is present.  Pulmonary: No respiratory distress and normal respiratory rhythm and effort.  Cardiovascular: Heart rate and rhythm are normal . No peripheral edema.  Rectal: The prostate is smooth and flat.  Genitourinary: Examination of the penis demonstrates no lesions and a normal meatus.    Results/Data Urine [Data Includes: Last 1 Day]   [id number]  COLOR YELLOW   APPEARANCE CLEAR   SPECIFIC GRAVITY 1.025   pH 5.5   GLUCOSE NEG mg/dL  BILIRUBIN NEG   KETONE NEG mg/dL  BLOOD NEG   PROTEIN NEG mg/dL  UROBILINOGEN 0.2 mg/dL  NITRITE NEG   LEUKOCYTE ESTERASE NEG   Selected Results  PSA [id number] 02:54PM Raynelle Bring  SPECIMEN TYPE: BLOOD   Test Name Result Flag Reference  PSA 0.06 ng/mL  <=4.00  TEST METHODOLOGY: ECLIA PSA (ELECTROCHEMILUMINESCENCE IMMUNOASSAY)   CMP-CT [id number] 02:53PM Raynelle Bring  SPECIMEN TYPE: BLOOD   Test Name Result Flag Reference  GLUCOSE 113 mg/dL H 70-99  SODIUM 138 mEq/L  135-145  POTASSIUM 4.1 mEq/L  3.5-5.3  CHLORIDE 101 mEq/L  96-112  CO2 30 mEq/L  19-32  CALCIUM 9.1 mg/dL  8.4-10.5  TOTAL PROTEIN 6.7 g/dL  6.0-8.3  ALBUMIN 3.9 g/dL  3.5-5.2  AST/SGOT 19 U/L  0-37  ALT/SGPT 28 U/L  0-53  ALKALINE PHOSPHATASE 100 U/L  39-117  BILIRUBIN, TOTAL 0.5 mg/dL  0.3-1.2   BUN & CREATININE [id number] 02:53PM Raynelle Bring  SPECIMEN TYPE: BLOOD    Test Name Result Flag Reference  CREATININE 1.60 mg/dL H 0.50-1.70  BUN 29 mg/dL  6-30  Est GFR, African American 46 mL/min L   PERFORMED AT:        [facility]                      [address]                      [place], [place] [address]  Est GFR, NonAfrican American 40 mL/min L   THE ESTIMATED GFR IS A CALCULATION VALID FOR ADULTS (>=44 YEARS OLD) THAT USES THE CKD-EPI ALGORITHM TO ADJUST FOR AGE AND SEX. IT IS   NOT TO BE USED FOR CHILDREN, PREGNANT WOMEN, HOSPITALIZED PATIENTS,    PATIENTS ON DIALYSIS, OR WITH RAPIDLY CHANGING KIDNEY FUNCTION. ACCORDING TO THE NKDEP, EGFR >89 IS NORMAL, 60-89 SHOWS MILD IMPAIRMENT, 30-59 SHOWS MODERATE IMPAIRMENT, 15-29 SHOWS SEVERE IMPAIRMENT AND <15 IS ESRD.   AU CT-HEMATURIA PROTOCOL [id number] 12:00AM Raynelle Bring   Test Name Result Flag Reference  AU CT-HEMATURIA PROTOCOL (Report)    ** RADIOLOGY REPORT BY [facility] **   CLINICAL DATA: History of right-sided transitional cell carcinoma of the ureter. Right nephrectomy and ureterectomy 01/25/2013. Laser ablation. Prostate cancer in 2005. Radiation and  hormone deprivation therapy. Remote history of colon cancer and head neck cancer.  EXAM: CT ABDOMEN AND PELVIS WITHOUT AND WITH CONTRAST  TECHNIQUE: Multidetector CT imaging of the abdomen and pelvis was performed without contrast material in one or both body regions, followed by contrast material(s) and further sections in one or both body regions.  CONTRAST: 90 cc Isovue-300  COMPARISON: 11/20/2012.  FINDINGS: Lower Chest: Centrilobular emphysema. Normal heart size without pericardial or pleural effusion. Minimal bilateral pleural thickening. Calcified subcarinal 2.7 cm node, most consistent with old granulomatous disease.  Abdomen/Pelvis: No left-sided urinary tract calculi on unenhanced imaging.  Mild hepatic steatosis. Old granulomatous disease within the liver and spleen. Normal stomach.  Transverse duodenal diverticulum. Normal pancreas, gallbladder, biliary tract, adrenal glands.  Status post interval right nephrectomy and right ureterectomy.  No left renal mass on contrast enhanced images.  Port to moderate left renal collecting system opacification on delayed images. Good left ureteric opacification. No filling defect within the opacified portions and no left ureteric enhancing mass.  Advanced aortic and branch vessel atherosclerosis. No retroperitoneal or retrocrural adenopathy. Scattered colonic diverticula. Normal colon and terminal ileum. Normal small bowel without abdominal ascites.  Small fat containing right inguinal hernia. No pelvic adenopathy.  Mild bladder wall irregularity with a right-sided bladder diverticulum on image 84/series 6. Incomplete bladder distention on delayed images, without well-defined filling defect or enhancing lesion.  Radiation seeds in the prostate. No significant free fluid.  Right-sided abdominal wall scarring.  Bones/Musculoskeletal: Presumed degenerative fusion of the bilateral sacroiliac joints.  IMPRESSION: 1. Interval right nephrectomy and ureterectomy, without evidence of locally recurrent or metastatic disease. 2. No evidence of metachronous transitional cell carcinoma; suboptimal distension of the bladder on delayed images and only poor to moderate left renal collecting system opacification. 3. Component of bladder outlet obstruction, with bladder wall irregularity and a right-sided bladder diverticulum.   Electronically Signed  By: Abigail Miyamoto M.D.  On: 09/29/2013 16:54    I independently reviewed his chest x-ray and CT scan. Findings are dictated above. There was no evidence of metastatic disease to the chest or recurrent metastatic disease to the abdomen or pelvis.  Procedure  Procedure: Cystoscopy  Chaperone Present: Uvaldo Bristle.  Indication: History of Urothelial Carcinoma.  Informed Consent: Risks,  benefits, and potential adverse events were discussed and informed consent was obtained from the patient.  Prep: The patient was prepped with betadine.  Anesthesia:. Local anesthesia was administered intraurethrally with 2% lidocaine jelly.  Antibiotic prophylaxis: Ciprofloxacin.  Procedure Note:  Urethral meatus:. A stricture was present. Dilation was performed with a meatal dilator.  Anterior urethra: No abnormalities.  Prostatic urethra: No abnormalities . The lateral prostatic lobes were enlarged.  Bladder: Visulization was clear. The right ureteral orifice was absent. The left ureteral orifice was in the normal anatomic position and had clear efflux of urine. Systematic examination of the bladder was performed. The right ureteral orifice is absent. There is a diverticulum in the vicinity of the right ureteral orifice and just to the right and lateral of his diverticulum is an erythematous area that is slightly raised and is concerning for urothelial carcinoma or carcinoma in situ. This area measures approximately 1 x 3 cm. The patient tolerated the procedure well.  Complications: None.    Assessment Assessed  1. 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 Urethral stricture (598.9) 3. Prostate cancer (185)  Plan Health Maintenance  1. UA With REFLEX; [Do Not Release]; Status:Complete;   Done: [id number] 02:54PM Transitional cell carcinoma  of ureter  2. Follow-up Office  Follow-up - Will call to schedule surgery  Status: Hold For - Date of  Service  Requested for: [id number]  Discussion/Summary 1. Urothelial carcinoma: He has no evidence for metastatic disease. However, he does have findings concerning for recurrence within the bladder on cystoscopy. Bladder washes been obtained for cytology. I have recommended that he proceed with cystoscopy, examination under anesthesia, and transurethral resection of his apparent recurrence. We reviewed the potential risks and complications as well  as the expected recovery process associated with this procedure. He gives his informed consent.    2. Prostate cancer: No evidence for disease recurrence.    3. Urethral stricture: This was dilated today prior to cystoscopy.    Cc: Dr. Jerilee Hoh     Verified Results URINE CYTOLOGY1 [id number] 03:56PM1 [employer]  SPECIMEN TYPE: OTHER   Test Name Result Flag Reference  FINAL DIAGNOSIS:1  A1   - CELLS PRESENT SUSPICIOUS FOR MALIGNANCY. - RULE OUT UROTHELIAL NEOPLASIA. DUE TO THE DIAGNOSIS RENDERED ON THIS PATIENT WE REQUEST THAT THE CYTOLOGY LABORATORY BE PROVIDED WITH ADDITIONAL FOLLOW UP ON THE PATIENT WHEN AVAILABLE.  SOURCE:1 Bladder Washing1    120CC OF CLOUDY LIGHT YELLOW BLW RECEIVED 1 SLIDE PREPARED 761950 LW  Relevant Clinical Info1     TRANSITIONAL CELL CARCINOMA OF URETER 189.2  PATHOLOGIST:1     REVIEWED BY Odis Hollingshead, MD, PHD, FCAP (ELECTRONIC SIGNATURE ON FILE)  NUMBER OF SLIDES1     1 Container Submitted  CYTOTECHNOLOGIST:1     JLT, BS CT(ASCP)     1. Amended By: Raynelle Bring; Oct 07 2013 2:47 PM EST  Signatures Electronically signed by : Raynelle Bring, M.D.; Oct 07 2013  2:48PM EST

## 2013-10-13 NOTE — Progress Notes (Signed)
Called RX for Mupirocin(Bactroban) 2% ointment to be applied each nostril twice daily for 5 days- 22gm tube-no refills to pts pharmacy at [phone].  Notified patient

## 2013-10-14 ENCOUNTER — Ambulatory Visit (HOSPITAL_COMMUNITY)
Admission: RE | Admit: 2013-10-14 | Discharge: 2013-10-14 | Disposition: A | Discharge status: Home / Self Care | Payer: Medicare Other | Source: Ambulatory Visit | Attending: Urology | Admitting: Urology

## 2013-10-14 ENCOUNTER — Ambulatory Visit (HOSPITAL_COMMUNITY): Payer: Medicare Other | Admitting: Anesthesiology

## 2013-10-14 ENCOUNTER — Encounter (HOSPITAL_COMMUNITY): Payer: Self-pay

## 2013-10-14 ENCOUNTER — Encounter (HOSPITAL_COMMUNITY)
Admission: RE | Disposition: A | Discharge status: Home / Self Care | Payer: Self-pay | Source: Ambulatory Visit | Attending: Urology

## 2013-10-14 ENCOUNTER — Encounter (HOSPITAL_COMMUNITY): Payer: Medicare Other | Admitting: Anesthesiology

## 2013-10-14 DIAGNOSIS — M899 Disorder of bone, unspecified: Secondary | ICD-10-CM | POA: Insufficient documentation

## 2013-10-14 DIAGNOSIS — N3020 Other chronic cystitis without hematuria: Secondary | ICD-10-CM | POA: Insufficient documentation

## 2013-10-14 DIAGNOSIS — Z8559 Personal history of malignant neoplasm of other urinary tract organ: Secondary | ICD-10-CM | POA: Insufficient documentation

## 2013-10-14 DIAGNOSIS — N323 Diverticulum of bladder: Secondary | ICD-10-CM | POA: Insufficient documentation

## 2013-10-14 DIAGNOSIS — N35919 Unspecified urethral stricture, male, unspecified site: Secondary | ICD-10-CM | POA: Insufficient documentation

## 2013-10-14 DIAGNOSIS — I10 Essential (primary) hypertension: Secondary | ICD-10-CM | POA: Insufficient documentation

## 2013-10-14 DIAGNOSIS — E7800 Pure hypercholesterolemia, unspecified: Secondary | ICD-10-CM | POA: Insufficient documentation

## 2013-10-14 DIAGNOSIS — C669 Malignant neoplasm of unspecified ureter: Secondary | ICD-10-CM | POA: Insufficient documentation

## 2013-10-14 DIAGNOSIS — Z9049 Acquired absence of other specified parts of digestive tract: Secondary | ICD-10-CM | POA: Insufficient documentation

## 2013-10-14 DIAGNOSIS — Z79899 Other long term (current) drug therapy: Secondary | ICD-10-CM | POA: Insufficient documentation

## 2013-10-14 DIAGNOSIS — Z800 Family history of malignant neoplasm of digestive organs: Secondary | ICD-10-CM | POA: Insufficient documentation

## 2013-10-14 DIAGNOSIS — N3289 Other specified disorders of bladder: Secondary | ICD-10-CM | POA: Insufficient documentation

## 2013-10-14 DIAGNOSIS — E119 Type 2 diabetes mellitus without complications: Secondary | ICD-10-CM | POA: Insufficient documentation

## 2013-10-14 DIAGNOSIS — N320 Bladder-neck obstruction: Secondary | ICD-10-CM | POA: Insufficient documentation

## 2013-10-14 DIAGNOSIS — N529 Male erectile dysfunction, unspecified: Secondary | ICD-10-CM | POA: Insufficient documentation

## 2013-10-14 DIAGNOSIS — M949 Disorder of cartilage, unspecified: Secondary | ICD-10-CM

## 2013-10-14 DIAGNOSIS — C61 Malignant neoplasm of prostate: Secondary | ICD-10-CM | POA: Insufficient documentation

## 2013-10-14 HISTORY — PX: TRANSURETHRAL RESECTION OF BLADDER TUMOR: SHX2575

## 2013-10-14 SURGERY — TURBT (TRANSURETHRAL RESECTION OF BLADDER TUMOR)
Anesthesia: General

## 2013-10-14 MED ORDER — FENTANYL CITRATE 0.05 MG/ML IJ SOLN: 25.0000 ug | INTRAMUSCULAR | Status: DC | PRN

## 2013-10-14 MED ORDER — ONDANSETRON HCL 4 MG/2ML IJ SOLN
INTRAMUSCULAR | Status: DC | PRN
  Administered 2013-10-14: 4 mg via INTRAVENOUS

## 2013-10-14 MED ORDER — HYDROCODONE-ACETAMINOPHEN 5-325 MG PO TABS: 1.0000 | ORAL_TABLET | Freq: Four times a day (QID) | ORAL | Status: DC | PRN

## 2013-10-14 MED ORDER — FENTANYL CITRATE 0.05 MG/ML IJ SOLN
INTRAMUSCULAR | Status: AC
  Filled 2013-10-14: qty 2

## 2013-10-14 MED ORDER — LACTATED RINGERS IV SOLN
INTRAVENOUS | Status: DC
  Administered 2013-10-14 (×2): via INTRAVENOUS

## 2013-10-14 MED ORDER — STERILE WATER FOR IRRIGATION IR SOLN
Status: DC | PRN
  Administered 2013-10-14: 1000 mL

## 2013-10-14 MED ORDER — PROPOFOL 10 MG/ML IV BOLUS
INTRAVENOUS | Status: DC | PRN
  Administered 2013-10-14: 30 mg via INTRAVENOUS
  Administered 2013-10-14: 140 mg via INTRAVENOUS

## 2013-10-14 MED ORDER — LIDOCAINE HCL (CARDIAC) 20 MG/ML IV SOLN
INTRAVENOUS | Status: DC | PRN
  Administered 2013-10-14: 40 mg via INTRAVENOUS

## 2013-10-14 MED ORDER — FENTANYL CITRATE 0.05 MG/ML IJ SOLN
INTRAMUSCULAR | Status: DC | PRN
  Administered 2013-10-14 (×3): 25 ug via INTRAVENOUS

## 2013-10-14 MED ORDER — SODIUM CHLORIDE 0.9 % IR SOLN
Status: DC | PRN
  Administered 2013-10-14: 6000 mL

## 2013-10-14 MED ORDER — CIPROFLOXACIN IN D5W 400 MG/200ML IV SOLN
INTRAVENOUS | Status: AC
  Filled 2013-10-14: qty 200

## 2013-10-14 MED ORDER — LIDOCAINE HCL (CARDIAC) 20 MG/ML IV SOLN
INTRAVENOUS | Status: AC
  Filled 2013-10-14: qty 5

## 2013-10-14 MED ORDER — GLYCOPYRROLATE 0.2 MG/ML IJ SOLN
INTRAMUSCULAR | Status: DC | PRN
  Administered 2013-10-14: 0.2 mg via INTRAVENOUS

## 2013-10-14 MED ORDER — ONDANSETRON HCL 4 MG/2ML IJ SOLN
INTRAMUSCULAR | Status: AC
  Filled 2013-10-14: qty 2

## 2013-10-14 MED ORDER — PROMETHAZINE HCL 25 MG/ML IJ SOLN: 6.2500 mg | INTRAMUSCULAR | Status: DC | PRN

## 2013-10-14 MED ORDER — CIPROFLOXACIN IN D5W 400 MG/200ML IV SOLN
400.0000 mg | INTRAVENOUS | Status: AC
  Administered 2013-10-14: 400 mg via INTRAVENOUS

## 2013-10-14 MED ORDER — PROPOFOL 10 MG/ML IV BOLUS
INTRAVENOUS | Status: AC
  Filled 2013-10-14: qty 20

## 2013-10-14 MED ORDER — PHENAZOPYRIDINE HCL 100 MG PO TABS: 100.0000 mg | ORAL_TABLET | Freq: Three times a day (TID) | ORAL | Status: DC | PRN

## 2013-10-14 SURGICAL SUPPLY — 15 items
BAG URINE DRAINAGE (UROLOGICAL SUPPLIES) IMPLANT
BAG URO CATCHER STRL LF (DRAPE) ×3 IMPLANT
DRAPE CAMERA CLOSED 9X96 (DRAPES) ×3 IMPLANT
ELECT REM PT RETURN 9FT ADLT (ELECTROSURGICAL) ×3
ELECTRODE REM PT RTRN 9FT ADLT (ELECTROSURGICAL) ×1 IMPLANT
EVACUATOR MICROVAS BLADDER (UROLOGICAL SUPPLIES) IMPLANT
GLOVE BIOGEL M STRL SZ7.5 (GLOVE) ×3 IMPLANT
GOWN STRL REUS W/TWL LRG LVL3 (GOWN DISPOSABLE) ×3 IMPLANT
KIT ASPIRATION TUBING (SET/KITS/TRAYS/PACK) IMPLANT
LOOPS RESECTOSCOPE DISP (ELECTROSURGICAL) ×3 IMPLANT
MANIFOLD NEPTUNE II (INSTRUMENTS) ×3 IMPLANT
PACK CYSTO (CUSTOM PROCEDURE TRAY) ×3 IMPLANT
SYRINGE IRR TOOMEY STRL 70CC (SYRINGE) IMPLANT
TUBING CONNECTING 10 (TUBING) ×2 IMPLANT
TUBING CONNECTING 10' (TUBING) ×1

## 2013-10-14 NOTE — Transfer of Care (Signed)
Immediate Anesthesia Transfer of Care Note  Patient: Melvin Kent  Procedure(s) Performed: Procedure(s) (LRB): TRANSURETHRAL RESECTION OF BLADDER TUMOR (TURBT), bladder biopsy, fulgeration (N/A)  Patient Location: PACU  Anesthesia Type: General  Level of Consciousness: sedated, patient cooperative and responds to stimulation  Airway & Oxygen Therapy: Patient Spontanous Breathing and Patient connected to face mask oxgen  Post-op Assessment: Report given to PACU RN and Post -op Vital signs reviewed and stable  Post vital signs: Reviewed and stable  Complications: No apparent anesthesia complications

## 2013-10-14 NOTE — Interval H&P Note (Signed)
History and Physical Interval Note:  10/14/2013 10:36 AM  Melvin Kent  has presented today for surgery, with the diagnosis of Bladder Tumor  The various methods of treatment have been discussed with the patient and family. After consideration of risks, benefits and other options for treatment, the patient has consented to  Procedure(s) with comments: TRANSURETHRAL RESECTION OF BLADDER TUMOR (TURBT) (N/A) - CYSTOSCOPY   as a surgical intervention .  The patient's history has been reviewed, patient examined, no change in status, stable for surgery.  I have reviewed the patient's chart and labs.  Questions were answered to the patient's satisfaction.     Melvin Kent,LES

## 2013-10-14 NOTE — Op Note (Signed)
Preoperative diagnosis: 1. Urothelial carcinoma 2. Bladder tumor (3 cm) 3. Urethral stricture  Postoperative diagnosis:  1. Urothelial carcinoma 2. Bladder tumor (3 cm) 3.   Urethral stricture  Procedure:  1. Cystoscopy 2. Transurethral resection of bladder tumor (3 cm) 3. Pelvic exam under anesthesia 4. Urethral dilation  Surgeon: Pryor Curia. M.D.  Anesthesia: General  Complications: None  Intraoperative findings:  1. Bladder tumor: There was a mostly flat erythematous lesion measuring about 3 cm just lateral to a right sided bladder diverticulum on the right lateral aspect of the bladder.  EBL: Minimal  Specimens: 1. Bladder tumor (right lateral)  Disposition of specimens: Pathology  Indication: Melvin Kent is a patient who was found to have a bladder tumor. He has a history of urothelial carcinoma of the right ureter s/p right nephroureterectomy. He was noted to have a tumor on follow cystoscopic surveillance and did have a suspicious cytology. After reviewing the management options for treatment, he elected to proceed with the above surgical procedure(s). We have discussed the potential benefits and risks of the procedure, side effects of the proposed treatment, the likelihood of the patient achieving the goals of the procedure, and any potential problems that might occur during the procedure or recuperation. Informed consent has been obtained.  Description of procedure:  The patient was taken to the operating room and general anesthesia was induced.  The patient was placed in the dorsal lithotomy position, prepped and draped in the usual sterile fashion, and preoperative antibiotics were administered. A preoperative time-out was performed.   Cystourethroscopy was performed.  An attempt to initially place the cystoscope revealed recurrent urethral narrowing and meatal stenosis.  Leander Rams sounds were used to serially dilate the urethra from 75 [place] to 28 [place].   The 52 French cystoscope sheath was then able to be easily inserted within the urethra.  Inspection of the urethra did not reveal any significant stricture more proximally then the meatus.  However, the urethra was generally of small caliber overall without focal stricture beyond the distal urethra.  The prostatic urethra appeared normal.  A systematic examination of the bladder was then performed.  This revealed a normal left ureteral orifice in its expected anatomic location effluxing clear urine.  Evaluation of the remainder of the bladder revealed a diverticulum in the vicinity where the bladder had been repaired after his right nephroureterectomy.  Along the right lateral bladder wall and just adjacent to this diverticulum, There was noted to be a raised and erythematous area measuring approximately 3 cm in largest diameter.  This was concerning for malignancy.  No other bladder tumors or other mucosal abnormalities were noted throughout the rest of the bladder.  Cold cup biopsy forceps were used to perform biopsies of this area and were sent for permanent pathologic analysis.  An attempt was then made to place a 26 [place] resectoscope.  Considering the smaller caliber of the patient's urethra, this was not possible and ultimately the resectoscope was removed and the cystoscope was replaced.  The cold cup biopsy forceps was then used to transurethrally resect the remainder of the tumor.  Once all visible tumor was resected, the [place] electrode was used to provide hemostasis.  The bladder was emptied and reinspected and there appeared to be excellent hemostasis and there was no remaining visible tumor.  The bladder was then emptied.  The patient tolerated the procedure well without complications.  He was able to be transferred to the recovery unit in satisfactory condition.  Pryor Curia MD

## 2013-10-14 NOTE — Discharge Instructions (Signed)
1. You may see some blood in the urine and may have some burning with urination for 48-72 hours. You also may notice that you have to urinate more frequently or urgently after your procedure which is normal.  °2. You should call should you develop an inability urinate, fever > 101, persistent nausea and vomiting that prevents you from eating or drinking to stay hydrated.  °

## 2013-10-14 NOTE — Preoperative (Signed)
Beta Blockers   Reason not to administer Beta Blockers:Not Applicable 

## 2013-10-14 NOTE — Anesthesia Preprocedure Evaluation (Addendum)
Anesthesia Evaluation  Patient identified by MRN, date of birth, ID band Patient awake    Reviewed: Allergy & Precautions, H&P , NPO status , Patient's Chart, lab work & pertinent test results  Airway Mallampati: II TM Distance: >3 FB Neck ROM: Full    Dental no notable dental hx.    Pulmonary sleep apnea , former smoker,  breath sounds clear to auscultation  Pulmonary exam normal       Cardiovascular hypertension, Pt. on medications Rhythm:Regular Rate:Normal     Neuro/Psych negative neurological ROS  negative psych ROS   GI/Hepatic negative GI ROS, Neg liver ROS,   Endo/Other  negative endocrine ROS  Renal/GU negative Renal ROS  negative genitourinary   Musculoskeletal negative musculoskeletal ROS (+)   Abdominal   Peds negative pediatric ROS (+)  Hematology negative hematology ROS (+)   Anesthesia Other Findings   Reproductive/Obstetrics negative OB ROS                          Anesthesia Physical Anesthesia Plan  ASA: II  Anesthesia Plan: General   Post-op Pain Management:    Induction: Intravenous  Airway Management Planned:   Additional Equipment:   Intra-op Plan:   Post-operative Plan:   Informed Consent: I have reviewed the patients History and Physical, chart, labs and discussed the procedure including the risks, benefits and alternatives for the proposed anesthesia with the patient or authorized representative who has indicated his/her understanding and acceptance.   Dental advisory given  Plan Discussed with: CRNA and Surgeon  Anesthesia Plan Comments:         Anesthesia Quick Evaluation

## 2013-10-14 NOTE — Anesthesia Postprocedure Evaluation (Signed)
  Anesthesia Post-op Note  Patient: Melvin Kent  Procedure(s) Performed: Procedure(s) (LRB): TRANSURETHRAL RESECTION OF BLADDER TUMOR (TURBT), bladder biopsy, fulgeration (N/A)  Patient Location: PACU  Anesthesia Type: General  Level of Consciousness: awake and alert   Airway and Oxygen Therapy: Patient Spontanous Breathing  Post-op Pain: mild  Post-op Assessment: Post-op Vital signs reviewed, Patient's Cardiovascular Status Stable, Respiratory Function Stable, Patent Airway and No signs of Nausea or vomiting  Last Vitals:  Filed Vitals:   10/14/13 1330  BP: 144/78  Pulse: 68  Temp: 36.4 C  Resp: 16    Post-op Vital Signs: stable   Complications: No apparent anesthesia complications

## 2013-10-15 ENCOUNTER — Encounter (HOSPITAL_COMMUNITY): Payer: Self-pay | Admitting: Urology

## 2014-05-10 ENCOUNTER — Ambulatory Visit (INDEPENDENT_AMBULATORY_CARE_PROVIDER_SITE_OTHER): Payer: Medicare Other | Admitting: Diagnostic Neuroimaging

## 2014-05-10 ENCOUNTER — Encounter: Payer: Self-pay | Admitting: Diagnostic Neuroimaging

## 2014-05-10 VITALS — BP 124/67 | HR 64 | Ht 68.0 in | Wt 209.2 lb

## 2014-05-10 DIAGNOSIS — R413 Other amnesia: Secondary | ICD-10-CM

## 2014-05-10 DIAGNOSIS — F411 Generalized anxiety disorder: Secondary | ICD-10-CM

## 2014-05-10 DIAGNOSIS — F3289 Other specified depressive episodes: Secondary | ICD-10-CM

## 2014-05-10 DIAGNOSIS — F32A Depression, unspecified: Secondary | ICD-10-CM

## 2014-05-10 DIAGNOSIS — F329 Major depressive disorder, single episode, unspecified: Secondary | ICD-10-CM
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
## 2014-05-10 NOTE — Patient Instructions (Signed)
Follow up after testing / evaluation at [facility].

## 2014-07-20 ENCOUNTER — Other Ambulatory Visit: Payer: Self-pay | Admitting: Urology

## 2014-07-21 NOTE — Progress Notes (Signed)
Called dr. Lynne Logan office to request orders be released in [place] surgery 07-28-14 pre op 07-25-14 Thanks

## 2014-07-22 NOTE — Patient Instructions (Addendum)
Melvin Kent  07/22/2014   Your procedure is scheduled on:  07/28/2014    Come thru the Emergency room entrance .  Follow the Signs to [place] at 0530       am  Call this number if you have problems the morning of surgery: [phone]   Remember:   Do not eat food or drink liquids after midnight.   Take these medicines the morning of surgery with A SIP OF WATER: Hydrocodone if needed, Norvasc, Allegra, Ativan if needed, Ocean nasal spray if needed, Effexor   Do not wear jewelry,  Do not wear lotions, powders, or perfumes.  deodorant.  . Men may shave face and neck.  Do not bring valuables to the hospital.  Contacts, dentures or bridgework may not be worn into surgery.       Patients discharged the day of surgery will not be allowed to drive  home.  Name and phone number of your driver:   [facility] - Preparing for Surgery Before surgery, you can play an important role.  Because skin is not sterile, your skin needs to be as free of germs as possible.  You can reduce the number of germs on your skin by washing with CHG (chlorahexidine gluconate) soap before surgery.  CHG is an antiseptic cleaner which kills germs and bonds with the skin to continue killing germs even after washing. Please DO NOT use if you have an allergy to CHG or antibacterial soaps.  If your skin becomes reddened/irritated stop using the CHG and inform your nurse when you arrive at Short Stay. Do not shave (including legs and underarms) for at least 48 hours prior to the first CHG shower.  You may shave your face/neck. Please follow these instructions carefully:  1.  Shower with CHG Soap the night before surgery and the  morning of Surgery.  2.  If you choose to wash your hair, wash your hair first as usual with your  normal  shampoo.  3.  After you shampoo, rinse your hair and body thoroughly to remove the  shampoo.                           4.  Use CHG as you would any other liquid soap.  You can apply chg  directly  to the skin and wash                       Gently with a scrungie or clean washcloth.  5.  Apply the CHG Soap to your body ONLY FROM THE NECK DOWN.   Do not use on face/ open                           Wound or open sores. Avoid contact with eyes, ears mouth and genitals (private parts).                       Wash face,  Genitals (private parts) with your normal soap.             6.  Wash thoroughly, paying special attention to the area where your surgery  will be performed.  7.  Thoroughly rinse your body with warm water from the neck down.  8.  DO NOT shower/wash with your normal soap after using and rinsing off  the CHG Soap.  9.  Pat yourself dry with a clean towel.            10.  Wear clean pajamas.            11.  Place clean sheets on your bed the night of your first shower and do not  sleep with pets. Day of Surgery : Do not apply any lotions/deodorants the morning of surgery.  Please wear clean clothes to the hospital/surgery center.  FAILURE TO FOLLOW THESE INSTRUCTIONS MAY RESULT IN THE CANCELLATION OF YOUR SURGERY PATIENT SIGNATURE_________________________________  NURSE SIGNATURE__________________________________  ________________________________________________________________________     Please read over the following fact sheets that you were given: MRSA Information, coughing and deep breathing exercises, leg exercises

## 2014-07-25 ENCOUNTER — Encounter (HOSPITAL_COMMUNITY): Payer: Self-pay

## 2014-07-25 ENCOUNTER — Encounter (HOSPITAL_COMMUNITY)
Admission: RE | Admit: 2014-07-25 | Discharge: 2014-07-25 | Disposition: A | Discharge status: Home / Self Care | Payer: Medicare Other | Source: Ambulatory Visit | Attending: Urology | Admitting: Urology

## 2014-07-25 DIAGNOSIS — M1990 Unspecified osteoarthritis, unspecified site: Secondary | ICD-10-CM | POA: Diagnosis not present

## 2014-07-25 DIAGNOSIS — N529 Male erectile dysfunction, unspecified: Secondary | ICD-10-CM | POA: Diagnosis not present

## 2014-07-25 DIAGNOSIS — N358 Other urethral stricture: Secondary | ICD-10-CM | POA: Diagnosis not present

## 2014-07-25 DIAGNOSIS — E119 Type 2 diabetes mellitus without complications: Secondary | ICD-10-CM | POA: Diagnosis not present

## 2014-07-25 DIAGNOSIS — Z8546 Personal history of malignant neoplasm of prostate: Secondary | ICD-10-CM | POA: Diagnosis not present

## 2014-07-25 DIAGNOSIS — K219 Gastro-esophageal reflux disease without esophagitis: Secondary | ICD-10-CM | POA: Diagnosis not present

## 2014-07-25 DIAGNOSIS — Z880 Allergy status to penicillin: Secondary | ICD-10-CM | POA: Diagnosis not present

## 2014-07-25 DIAGNOSIS — M8580 Other specified disorders of bone density and structure, unspecified site: Secondary | ICD-10-CM | POA: Diagnosis not present

## 2014-07-25 DIAGNOSIS — Z87891 Personal history of nicotine dependence: Secondary | ICD-10-CM | POA: Diagnosis not present

## 2014-07-25 DIAGNOSIS — E780 Pure hypercholesterolemia: Secondary | ICD-10-CM | POA: Diagnosis not present

## 2014-07-25 DIAGNOSIS — C679 Malignant neoplasm of bladder, unspecified: Secondary | ICD-10-CM | POA: Diagnosis not present

## 2014-07-25 DIAGNOSIS — N323 Diverticulum of bladder: Secondary | ICD-10-CM | POA: Diagnosis not present

## 2014-07-25 DIAGNOSIS — N359 Urethral stricture, unspecified: Secondary | ICD-10-CM | POA: Diagnosis present

## 2014-07-25 DIAGNOSIS — C4440 Unspecified malignant neoplasm of skin of scalp and neck: Secondary | ICD-10-CM

## 2014-07-25 DIAGNOSIS — I10 Essential (primary) hypertension: Secondary | ICD-10-CM | POA: Diagnosis not present

## 2014-07-25 DIAGNOSIS — F329 Major depressive disorder, single episode, unspecified: Secondary | ICD-10-CM | POA: Diagnosis not present

## 2014-07-25 HISTORY — DX: Unspecified malignant neoplasm of skin of scalp and neck: C44.40

## 2014-07-25 LAB — SURGICAL PCR SCREEN
MRSA, PCR: POSITIVE — AB
Staphylococcus aureus: POSITIVE — AB

## 2014-07-25 LAB — BASIC METABOLIC PANEL
Anion gap: 12 (ref 5–15)
BUN: 26 mg/dL — ABNORMAL HIGH (ref 6–23)
CO2: 27 mEq/L (ref 19–32)
Calcium: 9.5 mg/dL (ref 8.4–10.5)
Chloride: 100 mEq/L (ref 96–112)
Creatinine, Ser: 1.32 mg/dL (ref 0.50–1.35)
GFR, EST AFRICAN AMERICAN: 56 mL/min — AB (ref >90)
GFR, EST NON AFRICAN AMERICAN: 49 mL/min — AB (ref >90)
Glucose, Bld: 206 mg/dL — ABNORMAL HIGH (ref 70–99)
Potassium: 4.7 mEq/L (ref 3.7–5.3)
SODIUM: 139 meq/L (ref 137–147)

## 2014-07-25 LAB — CBC
HCT: 39.8 % (ref 39.0–52.0)
Hemoglobin: 12.8 g/dL — ABNORMAL LOW (ref 13.0–17.0)
MCH: 28.2 pg (ref 26.0–34.0)
MCHC: 32.2 g/dL (ref 30.0–36.0)
MCV: 87.7 fL (ref 78.0–100.0)
PLATELETS: 206 10*3/uL (ref 150–400)
RBC: 4.54 MIL/uL (ref 4.22–5.81)
RDW: 13.9 % (ref 11.5–15.5)
WBC: 7.3 10*3/uL (ref 4.0–10.5)

## 2014-07-25 NOTE — Progress Notes (Signed)
EKG- 07/13/14- chart CXR- 10/06/13 EPIC  LOV- 04/21/14-DR Wahid Stress Test- 03/16/14- chart  Echo- 08/02/13 chart

## 2014-07-27 NOTE — Anesthesia Preprocedure Evaluation (Signed)
Anesthesia Evaluation  Patient identified by MRN, date of birth, ID band Patient awake    Reviewed: Allergy & Precautions, H&P , NPO status , Patient's Chart, lab work & pertinent test results  Airway Mallampati: II  TM Distance: >3 FB Neck ROM: Full    Dental no notable dental hx.    Pulmonary sleep apnea , former smoker,  breath sounds clear to auscultation  Pulmonary exam normal       Cardiovascular hypertension, Pt. on medications + Peripheral Vascular Disease Rhythm:Regular Rate:Normal     Neuro/Psych PSYCHIATRIC DISORDERS Anxiety Depression negative neurological ROS     GI/Hepatic Neg liver ROS, GERD-  ,  Endo/Other  negative endocrine ROSdiabetes, Type 2, Oral Hypoglycemic Agents  Renal/GU Renal diseasenegative Renal ROS     Musculoskeletal negative musculoskeletal ROS (+)   Abdominal   Peds  Hematology negative hematology ROS (+)   Anesthesia Other Findings   Reproductive/Obstetrics negative OB ROS                             Anesthesia Physical  Anesthesia Plan  ASA: III  Anesthesia Plan: General   Post-op Pain Management:    Induction: Intravenous  Airway Management Planned: LMA and Oral ETT  Additional Equipment:   Intra-op Plan:   Post-operative Plan: Extubation in OR  Informed Consent: I have reviewed the patients History and Physical, chart, labs and discussed the procedure including the risks, benefits and alternatives for the proposed anesthesia with the patient or authorized representative who has indicated his/her understanding and acceptance.   Dental advisory given  Plan Discussed with: CRNA  Anesthesia Plan Comments:         Anesthesia Quick Evaluation                                   Anesthesia Evaluation  Patient identified by MRN, date of birth, ID band Patient awake    Reviewed: Allergy & Precautions, H&P , NPO status , Patient's  Chart, lab work & pertinent test results  Airway Mallampati: II TM Distance: >3 FB Neck ROM: full    Dental  (+) Edentulous Upper, Missing and Dental Advisory Given Missing many lower teeth too:   Pulmonary neg pulmonary ROS, shortness of breath and with exertion,  breath sounds clear to auscultation  Pulmonary exam normal       Cardiovascular hypertension, Pt. on medications + DOE negative cardio ROS  Rhythm:regular Rate:Normal     Neuro/Psych Depression 80% Carotid artery occlusion negative neurological ROS  negative psych ROS   GI/Hepatic negative GI ROS, Neg liver ROS,   Endo/Other  negative endocrine ROS  Renal/GU negative Renal ROS  negative genitourinary   Musculoskeletal   Abdominal   Peds  Hematology negative hematology ROS (+)   Anesthesia Other Findings   Reproductive/Obstetrics negative OB ROS                          Anesthesia Physical Anesthesia Plan  ASA: III  Anesthesia Plan: General   Post-op Pain Management:    Induction: Intravenous  Airway Management Planned: LMA  Additional Equipment:   Intra-op Plan:   Post-operative Plan:   Informed Consent: I have reviewed the patients History and Physical, chart, labs and discussed the procedure including the risks, benefits and alternatives for the proposed anesthesia with the patient or authorized  representative who has indicated his/her understanding and acceptance.   Dental Advisory Given  Plan Discussed with: CRNA and Surgeon  Anesthesia Plan Comments:         Anesthesia Quick Evaluation

## 2014-07-27 NOTE — H&P (Signed)
History of Present Illness Mr. Melvin Kent is an 80 year old with the following urologic history:    1) Prostate cancer: He was diagnosed in 2005 and underwent treatment with IMRT (76 Gy) and 2 years of Lupron. His PSA has been undetectable.    TNM stage: cT2c N0 M0  Gleason score: 4+5=9  Pretreatment PSA: 7.3    2) Erectile dysfunction: He has a history of a decreased libido and has previously been given sample for Viagra which he has not previously tried.     3) Osteopenia: He does have a history of osteopenia on prior bone density studies, the last of which was performed in 06/2006. He has been on vitamin D and calcium supplementation and has been engaging in regular exercise at the [facility].    4) Urothelial carcinoma: He was noted to have persistent microscopic hematuria in February 2014. He had denied gross hematuria and has no history of tobacco use or family history of GU malignancy. He underwent an evaluation including a CT scan of the abdomen and pelvis and was noted to have a right ureteral mass prompting ureteroscopy and biopsies. His pathology initially indicated a low grade urothelial carcinoma after undergoing biopsies of this mass endoscopically. We discussed treatment options and he elected to proceed with endoscopic laser ablation. This was performed in April 2014 and his tumor was completely ablated. However, his pathology from this procedure indicated abundant high grade urothelial carcinoma. Based on the finding of high grade disease, he elected to undergo a right RAL nephroureterectomy on 01/25/13.    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 He denies any  hematuria or difficulty voiding recently.   Past Medical History Problems  1. History of Arthritis 2. History of Exposed To [place] - Dioxin 3. History of depression (Z86.59) 4. History of diabetes mellitus (Z86.39) 5. History of hypercholesterolemia (Z86.39) 6. History of hypertension (Z86.79) 7. Personal history of prostate cancer (Z85.46) 8. History of Thrombophlebitis  Surgical History Problems  1. History of Arthroscopy Knee 2. History of Cystoscopy For Urethral Stricture 3. History of Cystoscopy For Urethral Stricture 4. History of Cystoscopy With Fulguration Medium Lesion (2-5cm) 5. History of Cystoscopy With Insertion Of Ureteral Stent Right 6. History of Cystoscopy With Insertion Of Ureteral Stent Right 7. History of Cystoscopy With Resection Of Tumor 8. History of Cystoscopy With Ureteroscopy For Biopsy Right 9. History of Inguinal Hernia Repair 10. History of Kidney Surgery Laparoscopically Assisted Nephroureterectomy 11. History of Nose Surgery 12. History of Partial Colectomy 13. History of Surgery Of Male Genitalia Vasectomy  Current Meds 1. Allegra 180 MG TABS;  Therapy: (Recorded:02May2008) to Recorded 2. Aspir-81 81 MG Oral Tablet Delayed Release;  Therapy: (Recorded:02May2008) to Recorded 3. Benicar TABS;  Therapy: (Recorded:19Nov2010) to Recorded 4. Caltrate 600 TABS;  Therapy: (Recorded:02May2008) to Recorded 5. Cinnamon 500 MG Oral Capsule;  Therapy: (Recorded:02May2008) to Recorded 6. Crestor TABS;  Therapy: (Recorded:19May2010) to Recorded 7. FiberCon TABS;  Therapy: (Recorded:02May2008) to Recorded 8. Fish Oil CAPS;  Therapy: (Recorded:02May2008) to Recorded 9. Garlic CAPS;  Therapy: (Recorded:02May2008) to Recorded 10. ICaps MV TABS;   Therapy: (Recorded:02May2008) to Recorded 11. Multivitamins TABS;   Therapy: (Recorded:02May2008) to Recorded 12. Pamine Forte TABS;  Therapy: (Recorded:02May2008) to  Recorded  Allergies Medication  1. Penicillins  Family History Problems  1. Family history of Colon Cancer : Son 2. Family history of Hypercholesterolemia : Daughter 3. Family history of Hypertension : Daughter  Social History Problems  1. Alcohol Use   rarely 2. Former smoker [id number]) 3. Marital History - Currently Married 4. Occupation:   retired from [employer] and 23 years of service in the [employer] 5. Denied: Tobacco Use  Vitals Vital Signs [Data Includes: Last 1 Day]  Recorded: 28Oct2015 08:41AM  Blood Pressure: 148 / 70 Temperature: 97.4 F Heart Rate: 68  Physical Exam Constitutional: Well nourished and well developed . No acute distress.  Pulmonary: No respiratory distress and normal respiratory rhythm and effort.  Cardiovascular: Heart rate and rhythm are normal . No peripheral edema.  Abdomen: No CVA tenderness.  Genitourinary: Examination of the penis demonstrates no lesions and a normal meatus.    Results/Data Urine [Data Includes: Last 1 Day]   28Oct2015  COLOR YELLOW   APPEARANCE CLEAR   SPECIFIC GRAVITY 1.020   pH 5.5   GLUCOSE NEG mg/dL  BILIRUBIN NEG   KETONE NEG mg/dL  BLOOD NEG   PROTEIN NEG mg/dL  UROBILINOGEN 0.2 mg/dL  NITRITE NEG   LEUKOCYTE ESTERASE NEG    Procedure I initially attempted to perform cystoscopy but was unable due to a bulbar urethral stricture. His genitalia had been prepped and draped in the usual sterile fashion. Topical lidocaine was inserted intraurethrally. I used filiform and followers to dilate him from 53 [place] up to 21 [place] in a serial fashion. Following dilation, I inserted the 16 French cystoscope without difficulty and the remainder of the urethra. The remainder of the urethra appeared normal without stricture disease. Systematic inspection of the bladder revealed no evidence of bladder stones. There was moderate trabeculation noted throughout the bladder. The right ureteral orifice was surgically absent. There  was a small diverticulum noted in the vicinity of the right ureteral hiatus. Within this diverticulum, a small papillary tumor measuring 0.5 cm was identified. No other bladder tumors were noted although there was some erythematous tissue surrounding the diverticulum possibly consistent with inflammatory change versus carcinoma. A bladder washing was obtained for cytology. The patient tolerated the procedure well and without complications. He was administered ciprofloxacin for prophylaxis.   Assessment Assessed  1. 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 Urethral stricture (N35.9) 3. Malignant neoplasm of overlapping sites of bladder (C67.8)  Plan Health Maintenance  1. UA With REFLEX; [Do Not Release]; Status:Complete;   Done: [id number] 08:31AM Transitional cell carcinoma of ureter  2. Follow-up Schedule Surgery Office  Follow-up  Status: Hold For - Appointment   Requested for: 28Oct2015 3. URINE CYTOLOGY; Status:Hold For - Specimen/Data Collection,Appointment;  Requested INO:[id number];   Discussion/Summary 1. Urothelial carcinoma of the right ureter/bladder tumor: He does have a small papillary bladder tumor noted today. Considering his history of high-grade disease, I did recommend that he proceed with cystoscopy under anesthesia, left retrograde pyelography, and transurethral resection of his bladder tumor. We have reviewed the potential risks and complications as well as expected recovery process associated with this procedure. He gives his informed consent to proceed.    2. Prostate cancer: He would be due for his next surveillance PSA in January 2016.    3. Urethral stricture: This was dilated and may require further dilation in the future for ongoing cystoscopic surveillance.    Cc: Dr. Jerilee Hoh     Verified Results  URINE CYTOLOGY1 28Oct2015 09:28AM1 Read Drivers  SPECIMEN TYPE: OTHER   Test Name Result Flag Reference  FINAL DIAGNOSIS:1  A1   - ABNORMAL  FINDINGS. - ATYPICAL UROTHELIAL CELLS ARE PRESENT.  SOURCE:1 Bladder Washing1    120CC OF LIGHT YELLOW BLW RECEIVED IN FIXATIVE 1 SLIDE PREPARED [id number] LW  Relevant Clinical Info1     TRANSITIONAL CELL CARCINOMA OF URETER  PATHOLOGIST:1     REVIEWED BY VALERIE J. FIELDS, MD, FCAP (ELECTRONIC SIGNATURE ON FILE)  NUMBER OF SLIDES1     1 Container Submitted  CYTOTECHNOLOGIST:1     LDL, MS CT(ASCP)     1. Amended By: Raynelle Bring; Jul 21 2014 9:07 PM EST  Signatures Electronically signed by : Raynelle Bring, M.D.; Jul 21 2014  9:08PM EST

## 2014-07-28 ENCOUNTER — Encounter (HOSPITAL_COMMUNITY)
Admission: RE | Disposition: A | Discharge status: Home / Self Care | Payer: Self-pay | Source: Ambulatory Visit | Attending: Urology

## 2014-07-28 ENCOUNTER — Ambulatory Visit (HOSPITAL_COMMUNITY): Payer: Medicare Other | Admitting: Anesthesiology

## 2014-07-28 ENCOUNTER — Ambulatory Visit (HOSPITAL_COMMUNITY)
Admission: RE | Admit: 2014-07-28 | Discharge: 2014-07-28 | Disposition: A | Discharge status: Home / Self Care | Payer: Medicare Other | Source: Ambulatory Visit | Attending: Urology | Admitting: Urology

## 2014-07-28 ENCOUNTER — Encounter (HOSPITAL_COMMUNITY): Payer: Self-pay | Admitting: *Deleted

## 2014-07-28 DIAGNOSIS — N358 Other urethral stricture: Secondary | ICD-10-CM | POA: Diagnosis not present

## 2014-07-28 DIAGNOSIS — M8580 Other specified disorders of bone density and structure, unspecified site: Secondary | ICD-10-CM | POA: Insufficient documentation

## 2014-07-28 DIAGNOSIS — I10 Essential (primary) hypertension: Secondary | ICD-10-CM | POA: Insufficient documentation

## 2014-07-28 DIAGNOSIS — N529 Male erectile dysfunction, unspecified: Secondary | ICD-10-CM | POA: Insufficient documentation

## 2014-07-28 DIAGNOSIS — Z8546 Personal history of malignant neoplasm of prostate: Secondary | ICD-10-CM | POA: Insufficient documentation

## 2014-07-28 DIAGNOSIS — K219 Gastro-esophageal reflux disease without esophagitis: Secondary | ICD-10-CM | POA: Insufficient documentation

## 2014-07-28 DIAGNOSIS — E119 Type 2 diabetes mellitus without complications: Secondary | ICD-10-CM | POA: Insufficient documentation

## 2014-07-28 DIAGNOSIS — C679 Malignant neoplasm of bladder, unspecified: Secondary | ICD-10-CM | POA: Insufficient documentation

## 2014-07-28 DIAGNOSIS — F329 Major depressive disorder, single episode, unspecified: Secondary | ICD-10-CM | POA: Insufficient documentation

## 2014-07-28 DIAGNOSIS — E780 Pure hypercholesterolemia: Secondary | ICD-10-CM | POA: Insufficient documentation

## 2014-07-28 DIAGNOSIS — Z87891 Personal history of nicotine dependence: Secondary | ICD-10-CM | POA: Insufficient documentation

## 2014-07-28 DIAGNOSIS — N323 Diverticulum of bladder: Secondary | ICD-10-CM | POA: Insufficient documentation

## 2014-07-28 DIAGNOSIS — Z880 Allergy status to penicillin: Secondary | ICD-10-CM | POA: Insufficient documentation

## 2014-07-28 DIAGNOSIS — M1990 Unspecified osteoarthritis, unspecified site: Secondary | ICD-10-CM | POA: Insufficient documentation

## 2014-07-28 HISTORY — PX: CYSTOSCOPY W/ RETROGRADES: SHX1426

## 2014-07-28 HISTORY — PX: TRANSURETHRAL RESECTION OF BLADDER TUMOR: SHX2575

## 2014-07-28 LAB — GLUCOSE, CAPILLARY
GLUCOSE-CAPILLARY: 149 mg/dL — AB (ref 70–99)
Glucose-Capillary: 167 mg/dL — ABNORMAL HIGH (ref 70–99)

## 2014-07-28 SURGERY — TURBT (TRANSURETHRAL RESECTION OF BLADDER TUMOR)
Anesthesia: General

## 2014-07-28 MED ORDER — PHENAZOPYRIDINE HCL 100 MG PO TABS: 100.0000 mg | ORAL_TABLET | Freq: Three times a day (TID) | ORAL | Status: DC | PRN

## 2014-07-28 MED ORDER — PROMETHAZINE HCL 25 MG/ML IJ SOLN: 6.2500 mg | INTRAMUSCULAR | Status: DC | PRN

## 2014-07-28 MED ORDER — OXYCODONE HCL 5 MG PO TABS: 5.0000 mg | ORAL_TABLET | Freq: Once | ORAL | Status: DC | PRN

## 2014-07-28 MED ORDER — MEPERIDINE HCL 50 MG/ML IJ SOLN: 6.2500 mg | INTRAMUSCULAR | Status: DC | PRN

## 2014-07-28 MED ORDER — EPHEDRINE SULFATE 50 MG/ML IJ SOLN
INTRAMUSCULAR | Status: AC
  Filled 2014-07-28: qty 1

## 2014-07-28 MED ORDER — FENTANYL CITRATE 0.05 MG/ML IJ SOLN
INTRAMUSCULAR | Status: AC
  Filled 2014-07-28: qty 2

## 2014-07-28 MED ORDER — CIPROFLOXACIN IN D5W 400 MG/200ML IV SOLN
INTRAVENOUS | Status: AC
  Filled 2014-07-28: qty 200

## 2014-07-28 MED ORDER — PROPOFOL 10 MG/ML IV BOLUS
INTRAVENOUS | Status: DC | PRN
  Administered 2014-07-28: 150 mg via INTRAVENOUS

## 2014-07-28 MED ORDER — LACTATED RINGERS IV SOLN
INTRAVENOUS | Status: DC | PRN
  Administered 2014-07-28: 07:00:00 via INTRAVENOUS

## 2014-07-28 MED ORDER — LIDOCAINE HCL (CARDIAC) 20 MG/ML IV SOLN
INTRAVENOUS | Status: DC | PRN
  Administered 2014-07-28: 100 mg via INTRAVENOUS

## 2014-07-28 MED ORDER — ONDANSETRON HCL 4 MG/2ML IJ SOLN
INTRAMUSCULAR | Status: DC | PRN
  Administered 2014-07-28: 4 mg via INTRAVENOUS

## 2014-07-28 MED ORDER — SODIUM CHLORIDE 0.9 % IJ SOLN
INTRAMUSCULAR | Status: AC
  Filled 2014-07-28: qty 10

## 2014-07-28 MED ORDER — ONDANSETRON HCL 4 MG/2ML IJ SOLN
INTRAMUSCULAR | Status: AC
  Filled 2014-07-28: qty 2

## 2014-07-28 MED ORDER — DEXAMETHASONE SODIUM PHOSPHATE 10 MG/ML IJ SOLN
INTRAMUSCULAR | Status: AC
  Filled 2014-07-28: qty 1

## 2014-07-28 MED ORDER — HYDROMORPHONE HCL 1 MG/ML IJ SOLN: 0.2500 mg | INTRAMUSCULAR | Status: DC | PRN

## 2014-07-28 MED ORDER — OXYCODONE HCL 5 MG/5ML PO SOLN
5.0000 mg | Freq: Once | ORAL | Status: DC | PRN
  Filled 2014-07-28: qty 5

## 2014-07-28 MED ORDER — IOHEXOL 300 MG/ML  SOLN
INTRAMUSCULAR | Status: DC | PRN
  Administered 2014-07-28: 5 mL

## 2014-07-28 MED ORDER — SODIUM CHLORIDE 0.9 % IR SOLN
Status: DC | PRN
  Administered 2014-07-28: 6000 mL via INTRAVESICAL

## 2014-07-28 MED ORDER — FENTANYL CITRATE 0.05 MG/ML IJ SOLN
INTRAMUSCULAR | Status: DC | PRN
  Administered 2014-07-28: 25 ug via INTRAVENOUS

## 2014-07-28 MED ORDER — PROPOFOL 10 MG/ML IV BOLUS
INTRAVENOUS | Status: AC
  Filled 2014-07-28: qty 20

## 2014-07-28 MED ORDER — LIDOCAINE HCL (CARDIAC) 20 MG/ML IV SOLN
INTRAVENOUS | Status: AC
  Filled 2014-07-28: qty 5

## 2014-07-28 MED ORDER — CIPROFLOXACIN IN D5W 400 MG/200ML IV SOLN
400.0000 mg | INTRAVENOUS | Status: AC
  Administered 2014-07-28: 400 mg via INTRAVENOUS

## 2014-07-28 MED ORDER — DEXAMETHASONE SODIUM PHOSPHATE 10 MG/ML IJ SOLN
INTRAMUSCULAR | Status: DC | PRN
  Administered 2014-07-28: 10 mg via INTRAVENOUS

## 2014-07-28 MED ORDER — EPHEDRINE SULFATE 50 MG/ML IJ SOLN
INTRAMUSCULAR | Status: DC | PRN
  Administered 2014-07-28 (×2): 10 mg via INTRAVENOUS
  Administered 2014-07-28: 15 mg via INTRAVENOUS

## 2014-07-28 SURGICAL SUPPLY — 20 items
BAG URINE DRAINAGE (UROLOGICAL SUPPLIES) IMPLANT
BAG URO CATCHER STRL LF (DRAPE) ×4 IMPLANT
BALLN NEPHROSTOMY (BALLOONS) ×4
BALLOON NEPHROSTOMY (BALLOONS) ×2 IMPLANT
CATH URET 5FR 28IN OPEN ENDED (CATHETERS) ×4 IMPLANT
DRAPE CAMERA CLOSED 9X96 (DRAPES) ×4 IMPLANT
ELECT LOOP MED HF 24F 12D CBL (CLIP) IMPLANT
ELECT REM PT RETURN 9FT ADLT (ELECTROSURGICAL)
ELECTRODE REM PT RTRN 9FT ADLT (ELECTROSURGICAL) IMPLANT
EVACUATOR MICROVAS BLADDER (UROLOGICAL SUPPLIES) IMPLANT
GLOVE BIOGEL M STRL SZ7.5 (GLOVE) ×4 IMPLANT
GOWN STRL REUS W/TWL LRG LVL3 (GOWN DISPOSABLE) ×4 IMPLANT
GUIDEWIRE STR DUAL SENSOR (WIRE) ×4 IMPLANT
KIT ASPIRATION TUBING (SET/KITS/TRAYS/PACK) IMPLANT
LOOPS RESECTOSCOPE DISP (ELECTROSURGICAL) IMPLANT
MANIFOLD NEPTUNE II (INSTRUMENTS) ×4 IMPLANT
PACK CYSTO (CUSTOM PROCEDURE TRAY) ×4 IMPLANT
SYRINGE IRR TOOMEY STRL 70CC (SYRINGE) IMPLANT
TUBING CONNECTING 10 (TUBING) ×3 IMPLANT
TUBING CONNECTING 10' (TUBING) ×1

## 2014-07-28 NOTE — Discharge Instructions (Signed)
1. You may see some blood in the urine and may have some burning with urination for 48-72 hours. You also may notice that you have to urinate more frequently or urgently after your procedure which is normal.  °2. You should call should you develop an inability urinate, fever > 101, persistent nausea and vomiting that prevents you from eating or drinking to stay hydrated.  °

## 2014-07-28 NOTE — Op Note (Signed)
Preoperative diagnosis: 1. Bladder tumor (0.5 cm) 2. Urethral stricture  Postoperative diagnosis:  1. Bladder tumor (0.5 cm) 2. Urethral stricture  Procedure:  1. Cystoscopy 2. Balloon dilation of urethral stricture 3. Transurethral resection of bladder tumor (0.5 cm) 4. Left retrograde pyelography with interpretation  Surgeon: Pryor Curia. M.D.  Anesthesia: General  Complications: None  Intraoperative findings:  1. Bladder tumor: There was a 0.5 cm papillary bladder tumor noted in a diverticulum just the the right of the right hemitrigone. 2. Retrograde pyelography: Left retrograde pyelography was performed with omnipaque contrast and demonstrated no filling defects in the ureter or renal collecting system.  EBL: Minimal  Specimens: 1. Right bladder tumor  Disposition of specimens: Pathology  Indication: Melvin Kent is a patient with a history of urothelial carcinoma of the right ureter who was found to have a bladder tumor. After reviewing the management options for treatment, he elected to proceed with the above surgical procedure(s). We have discussed the potential benefits and risks of the procedure, side effects of the proposed treatment, the likelihood of the patient achieving the goals of the procedure, and any potential problems that might occur during the procedure or recuperation. Informed consent has been obtained.  Description of procedure:  The patient was taken to the operating room and general anesthesia was induced.  The patient was placed in the dorsal lithotomy position, prepped and draped in the usual sterile fashion, and preoperative antibiotics were administered. A preoperative time-out was performed.   Cystourethroscopy was performed.  The patient's urethra was examined and his known distal urethral stricture was immediately noted as the cystoscopy could not be passed even into the distal urethra.  I passed a 0.38 Sensor guidewire through the  urethra and easily into the bladder under fluoroscopic guidance.   I then inserted the 24 Fr Ultraxx balloon dilator over the wire into the distal urethra and it was inflated to 18 mmHg pressure for 5 minutes.  The balloon was then deflated.  Cystourethroscopy could then be easily performed with no other urethral abnormalities noted.    The bladder was then systematically examined in its entirety. The left ureteral orifice was in its expected location.  The right ureteral orifice was surgically absent. There was a diverticulum just to the right of the trigone and a small 0.5 cm papillary bladder tumor was noted in the diverticulum as had been noted previously. No other bladder tumors, stones, or other mucosal abnormalities were noted.  Attention then turned to the left ureteral orifice and a ureteral catheter was used to intubate the ureteral orifice.  Omnipaque contrast was injected through the ureteral catheter and a retrograde pyelogram was performed with findings as dictated above.  The bladder was then re-examined. Using the cold cup biopsy forceps, the bladder tumor in the diverticulum was removed for permanent pathologic analysis.  The entire tumor was resected with two attempts and care was taken to avoid resecting too deep considering the tumor was in a diverticulum.   Separate biopsies were also taken of the underlying deep bladder tissue and sent as a separate specimen.   Hemostasis was then achieved with the bugbee electrode and the bladder was emptied and reinspected with no further bleeding noted at the end of the procedure.    The bladder was then emptied and the procedure ended.  The patient appeared to tolerate the procedure well and without complications.  The patient was able to be awakened and transferred to the recovery unit in satisfactory  condition.    Pryor Curia MD

## 2014-07-28 NOTE — Anesthesia Postprocedure Evaluation (Signed)
Anesthesia Post Note  Patient: Melvin Kent  Procedure(s) Performed: Procedure(s) (LRB): TRANSURETHRAL RESECTION OF BLADDER TUMOR (TURBT) with fulgeration (N/A) CYSTOSCOPY WITH RETROGRADE PYELOGRAM (Left)  Anesthesia type: General  Patient location: PACU  Post pain: Pain level controlled  Post assessment: Post-op Vital signs reviewed  Last Vitals: BP 145/75 mmHg  Pulse 73  Temp(Src) 36.6 C (Oral)  Resp 16  Ht 5\' 9"  (1.753 m)  Wt 215 lb (97.523 kg)  BMI 31.74 kg/m2  SpO2 95%  Post vital signs: Reviewed  Level of consciousness: sedated  Complications: No apparent anesthesia complications

## 2014-07-28 NOTE — Interval H&P Note (Signed)
History and Physical Interval Note:  07/28/2014 7:35 AM  Melvin Kent  has presented today for surgery, with the diagnosis of BLADDER TUMOR  The various methods of treatment have been discussed with the patient and family. After consideration of risks, benefits and other options for treatment, the patient has consented to  Procedure(s): TRANSURETHRAL RESECTION OF BLADDER TUMOR (TURBT) (N/A) CYSTOSCOPY WITH RETROGRADE PYELOGRAM (Left) as a surgical intervention .  The patient's history has been reviewed, patient examined, no change in status, stable for surgery.  I have reviewed the patient's chart and labs.  Questions were answered to the patient's satisfaction.     [name],LES

## 2014-07-28 NOTE — Transfer of Care (Signed)
Immediate Anesthesia Transfer of Care Note  Patient: Melvin Kent  Procedure(s) Performed: Procedure(s): TRANSURETHRAL RESECTION OF BLADDER TUMOR (TURBT) with fulgeration (N/A) CYSTOSCOPY WITH RETROGRADE PYELOGRAM (Left)  Patient Location: PACU  Anesthesia Type:General  Level of Consciousness: sedated  Airway & Oxygen Therapy: Patient Spontanous Breathing and Patient connected to face mask oxygen  Post-op Assessment: Report given to PACU RN and Post -op Vital signs reviewed and stable  Post vital signs: Reviewed and stable  Complications: No apparent anesthesia complications

## 2014-07-29 ENCOUNTER — Encounter (HOSPITAL_COMMUNITY): Payer: Self-pay | Admitting: Urology

## 2014-08-10 ENCOUNTER — Ambulatory Visit (INDEPENDENT_AMBULATORY_CARE_PROVIDER_SITE_OTHER): Payer: Medicare Other | Admitting: Diagnostic Neuroimaging

## 2014-08-10 ENCOUNTER — Encounter: Payer: Self-pay | Admitting: Diagnostic Neuroimaging

## 2014-08-10 VITALS — BP 134/70 | HR 53 | Temp 97.8°F | Ht 66.5 in | Wt 205.8 lb

## 2014-08-10 DIAGNOSIS — R413 Other amnesia: Secondary | ICD-10-CM
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
## 2014-08-10 NOTE — Patient Instructions (Signed)
I will check labs and MRI brain.

## 2014-08-11 LAB — TSH: TSH: 2.5 u[IU]/mL (ref 0.450–4.500)

## 2014-08-11 LAB — HEMOGLOBIN A1C
Est. average glucose Bld gHb Est-mCnc: 160 mg/dL
HEMOGLOBIN A1C: 7.2 % — AB (ref 4.8–5.6)

## 2014-08-11 LAB — VITAMIN B12: Vitamin B-12: 631 pg/mL (ref 211–946)

## 2014-08-11 LAB — HIV ANTIBODY (ROUTINE TESTING W REFLEX): HIV 1/HIV 2 AB: NONREACTIVE

## 2014-08-11 LAB — RPR QUALITATIVE: SYPHILIS RPR SCR: NONREACTIVE

## 2014-08-30 ENCOUNTER — Telehealth: Payer: Self-pay

## 2014-08-30 NOTE — Telephone Encounter (Signed)
-----   Message from Penni Bombard, MD sent at 08/29/2014 10:53 PM EST ----- Normal labs except A1c 7.2. Needs continued diabetes control per PCP. -VRP

## 2014-08-30 NOTE — Telephone Encounter (Signed)
Spouse returning call, please return call to mobile # [phone].

## 2014-08-30 NOTE — Telephone Encounter (Signed)
No answer

## 2014-08-31 NOTE — Telephone Encounter (Signed)
Spoke with spouse. Gave lab results. Spouse states patient has not been contacted for MRI. Gave [facility] phone number.

## 2014-09-02 ENCOUNTER — Encounter: Payer: Self-pay | Admitting: *Deleted

## 2014-11-24 ENCOUNTER — Ambulatory Visit
Admission: RE | Admit: 2014-11-24 | Discharge: 2014-11-24 | Disposition: A | Discharge status: Home / Self Care | Payer: Medicare Other | Source: Ambulatory Visit | Attending: Diagnostic Neuroimaging | Admitting: Diagnostic Neuroimaging

## 2014-11-24 DIAGNOSIS — R413 Other amnesia: Secondary | ICD-10-CM

## 2015-04-21 ENCOUNTER — Ambulatory Visit (INDEPENDENT_AMBULATORY_CARE_PROVIDER_SITE_OTHER): Payer: Medicare Other | Admitting: Diagnostic Neuroimaging

## 2015-04-21 ENCOUNTER — Encounter: Payer: Self-pay | Admitting: Diagnostic Neuroimaging

## 2015-04-21 VITALS — BP 130/68 | HR 53 | Ht 66.5 in | Wt 198.7 lb

## 2015-04-21 DIAGNOSIS — F4310 Post-traumatic stress disorder, unspecified: Secondary | ICD-10-CM | POA: Insufficient documentation

## 2015-04-21 DIAGNOSIS — F329 Major depressive disorder, single episode, unspecified: Secondary | ICD-10-CM | POA: Diagnosis not present

## 2015-04-21 DIAGNOSIS — R413 Other amnesia: Secondary | ICD-10-CM

## 2015-04-21 DIAGNOSIS — F0390 Unspecified dementia without behavioral disturbance: Secondary | ICD-10-CM | POA: Diagnosis not present

## 2015-04-21 DIAGNOSIS — F32A Depression, unspecified: Secondary | ICD-10-CM | POA: Insufficient documentation

## 2015-04-21 DIAGNOSIS — F03A0 Unspecified dementia, mild, without behavioral disturbance, psychotic disturbance, mood disturbance, and anxiety: Secondary | ICD-10-CM

## 2015-04-21 HISTORY — DX: Unspecified dementia without behavioral disturbance: F03.90

## 2015-04-21 HISTORY — DX: Unspecified dementia, mild, without behavioral disturbance, psychotic disturbance, mood disturbance, and anxiety: F03.A0
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
## 2015-04-25 ENCOUNTER — Telehealth: Payer: Self-pay | Admitting: Diagnostic Neuroimaging

## 2015-04-25 NOTE — Telephone Encounter (Signed)
Patient's wife is calling as her husband has a numbness in his left arm(elbow to fingers) that has lasted all day. Dr. Leta Baptist sees him for dementia. Larene Beach stated he need a new referral to see the doctor.

## 2015-05-12 ENCOUNTER — Other Ambulatory Visit: Payer: Self-pay | Admitting: Sports Medicine

## 2015-05-12 DIAGNOSIS — M542 Cervicalgia: Secondary | ICD-10-CM

## 2015-06-05 ENCOUNTER — Inpatient Hospital Stay: Admission: RE | Admit: 2015-06-05 | Payer: Medicare Other | Source: Ambulatory Visit

## 2015-08-29 ENCOUNTER — Encounter: Payer: Self-pay | Admitting: Family

## 2015-08-30 ENCOUNTER — Other Ambulatory Visit: Payer: Self-pay | Admitting: *Deleted

## 2015-08-30 DIAGNOSIS — I6523 Occlusion and stenosis of bilateral carotid arteries: Secondary | ICD-10-CM

## 2015-09-04 ENCOUNTER — Ambulatory Visit (HOSPITAL_COMMUNITY)
Admission: RE | Admit: 2015-09-04 | Discharge: 2015-09-04 | Disposition: A | Discharge status: Home / Self Care | Payer: Medicare Other | Source: Ambulatory Visit | Attending: Family | Admitting: Family

## 2015-09-04 ENCOUNTER — Encounter: Payer: Self-pay | Admitting: Family

## 2015-09-04 ENCOUNTER — Ambulatory Visit (INDEPENDENT_AMBULATORY_CARE_PROVIDER_SITE_OTHER): Payer: Medicare Other | Admitting: Family

## 2015-09-04 ENCOUNTER — Telehealth: Payer: Self-pay | Admitting: Diagnostic Neuroimaging

## 2015-09-04 VITALS — BP 133/69 | HR 49 | Temp 98.2°F | Resp 14 | Ht 66.0 in | Wt 189.0 lb

## 2015-09-04 DIAGNOSIS — I10 Essential (primary) hypertension: Secondary | ICD-10-CM | POA: Insufficient documentation

## 2015-09-04 DIAGNOSIS — E785 Hyperlipidemia, unspecified: Secondary | ICD-10-CM | POA: Diagnosis not present

## 2015-09-04 DIAGNOSIS — I6523 Occlusion and stenosis of bilateral carotid arteries: Secondary | ICD-10-CM | POA: Diagnosis not present

## 2015-09-04 DIAGNOSIS — E119 Type 2 diabetes mellitus without complications: Secondary | ICD-10-CM | POA: Insufficient documentation

## 2015-09-04 NOTE — Patient Instructions (Signed)
Stroke Prevention Some medical conditions and behaviors are associated with an increased chance of having a stroke. You may prevent a stroke by making healthy choices and managing medical conditions. HOW CAN I REDUCE MY RISK OF HAVING A STROKE?   Stay physically active. Get at least 30 minutes of activity on most or all days.  Do not smoke. It may also be helpful to avoid exposure to secondhand smoke.  Limit alcohol use. Moderate alcohol use is considered to be:  No more than 2 drinks per day for men.  No more than 1 drink per day for nonpregnant women.  Eat healthy foods. This involves:  Eating 5 or more servings of fruits and vegetables a day.  Making dietary changes that address high blood pressure (hypertension), high cholesterol, diabetes, or obesity.  Manage your cholesterol levels.  Making food choices that are high in fiber and low in saturated fat, trans fat, and cholesterol may control cholesterol levels.  Take any prescribed medicines to control cholesterol as directed by your health care provider.  Manage your diabetes.  Controlling your carbohydrate and sugar intake is recommended to manage diabetes.  Take any prescribed medicines to control diabetes as directed by your health care provider.  Control your hypertension.  Making food choices that are low in salt (sodium), saturated fat, trans fat, and cholesterol is recommended to manage hypertension.  Ask your health care provider if you need treatment to lower your blood pressure. Take any prescribed medicines to control hypertension as directed by your health care provider.  If you are 18-39 years of age, have your blood pressure checked every 3-5 years. If you are 40 years of age or older, have your blood pressure checked every year.  Maintain a healthy weight.  Reducing calorie intake and making food choices that are low in sodium, saturated fat, trans fat, and cholesterol are recommended to manage  weight.  Stop drug abuse.  Avoid taking birth control pills.  Talk to your health care provider about the risks of taking birth control pills if you are over 35 years old, smoke, get migraines, or have ever had a blood clot.  Get evaluated for sleep disorders (sleep apnea).  Talk to your health care provider about getting a sleep evaluation if you snore a lot or have excessive sleepiness.  Take medicines only as directed by your health care provider.  For some people, aspirin or blood thinners (anticoagulants) are helpful in reducing the risk of forming abnormal blood clots that can lead to stroke. If you have the irregular heart rhythm of atrial fibrillation, you should be on a blood thinner unless there is a good reason you cannot take them.  Understand all your medicine instructions.  Make sure that other conditions (such as anemia or atherosclerosis) are addressed. SEEK IMMEDIATE MEDICAL CARE IF:   You have sudden weakness or numbness of the face, arm, or leg, especially on one side of the body.  Your face or eyelid droops to one side.  You have sudden confusion.  You have trouble speaking (aphasia) or understanding.  You have sudden trouble seeing in one or both eyes.  You have sudden trouble walking.  You have dizziness.  You have a loss of balance or coordination.  You have a sudden, severe headache with no known cause.  You have new chest pain or an irregular heartbeat. Any of these symptoms may represent a serious problem that is an emergency. Do not wait to see if the symptoms will   go away. Get medical help at once. Call your local emergency services (911 in [place] Do not drive yourself to the hospital.   This information is not intended to replace advice given to you by your health care provider. Make sure you discuss any questions you have with your health care provider.   Document Released: 10/17/2004 Document Revised: 09/30/2014 Document Reviewed:  03/12/2013 Elsevier Interactive Patient Education 2016 Elsevier Inc.  

## 2015-09-04 NOTE — Telephone Encounter (Signed)
Juliann Pulse with [facility] is calling and would like to have this patient seen within the next 2 weeks as he is showing signs of a stroke.  Could he be worked in? [phone].  Thanks!

## 2015-09-04 NOTE — Telephone Encounter (Signed)
Spoke with Juliann Pulse, scheduler / [facility] who states Melvin Chambers,  FNP stated patient needs to be seen to evaluate for possible TIA. Patient and wife were given information about stroke signs, symptoms and told to seek immediate medical attention if he has any of those.  Called patient and spoke with wife. Scheduled patient for FU on 09/06/15; she understands to arrive 15 min early.

## 2015-09-04 NOTE — Progress Notes (Signed)
Chief Complaint: Extracranial Carotid Artery Stenosis   History of Present Illness  Melvin Kent is a 81 y.o. male patient of Dr. Kellie Simmering seen for followup of carotid artery stenosis.  Patient has not had previous carotid artery intervention.  He has had c-spine and L-spine surgery.   Wife states that he has intermittent numbness and weakness in one arm and then the other for the last 2 years, right arm more so than left. He does not have accompanying leg weakness or numbness. He has rare mumbled speech that does not occur at the same time as the arm numbness and this last happened about 3 weeks ago. Wife states pt has been evaluated by Dr. Tish Frederickson, and wife states he was diagnosed with dementia and PTSD. [place] was [place]   The patient reports New Medical or Surgical History: kidney removed and partial bladder resection for cancer.   Pt Diabetic: yes, seems in control Pt smoker: former smoker, quit in 1977, started smoking at age 68  Pt meds include: Statin : yes ASA: yes Other anticoagulants/antiplatelets: no   Past Medical History  Diagnosis Date  . Hyperlipidemia   . Hypertension   . Carotid artery occlusion   . Arthritis   . History of phlebitis 2 yrs ago  . Bowel incontinence     "bowel leakage"  . GERD (gastroesophageal reflux disease)     with certain foods - TUMS PRN  . Frequency of urination   . Depression   . Ureteral mass     right  . Skin cancer     colon/skin/prostate; UROTHELIAL CA OF RIGHT URETER  . H/O multiple allergies     weekly allergy injections at [facility]/ Dr Fredderick Phenix  . Diabetes mellitus without complication   . Anxiety   . Cancer   . Sleep apnea   . Skin cancer of scalp 07/25/2014     healing area on scalp covered with bandaid ( patient states " growth from inside out )   . Mild dementia 04/21/2015    Social History Social History  Substance Use Topics  . Smoking status: Former Smoker -- 2.00 packs/day for 40 years    Types: Cigarettes     Quit date: 09/23/1976  . Smokeless tobacco: Never Used  . Alcohol Use: No     Comment: quit; moderately drinking 83yrs ago    Family History Family History  Problem Relation Age of Onset  . Coronary artery disease Sister   . Coronary artery disease Brother   . Coronary artery disease Brother   . Cancer Mother   . Cancer Father     Surgical History Past Surgical History  Procedure Laterality Date  . Bilateral knee arthroscopies    . Lumbar laminectomy      x 2   . Prostatectomy      FOR PROSTATE CANCER  . Skin caners    . Skin cancer excision      NOSE AND LIP  . Appendectomy    . Tonsillectomy    . Hernia repair      BIL ing hernia repair  . Cataract extraction      bil  . Colon surgery      colon resection 1994 colon cancer  . Cystoscopy with retrograde pyelogram, ureteroscopy and stent placement Bilateral 12/03/2012    Procedure: Cystoscopy, Bilateral RetrogradePyelography, Right Ureteroscopy with Ureteral Biopsy,, Right Ureteral Stent placement;  Surgeon: Dutch Gray, MD;  Location: [facility];  Service: Urology;  Laterality: Bilateral;  Cystoscopy, Bilateral RetrogradePyelography, Right Ureteroscopy with Ureteral Biopsy,  Right Ureteral Stent placement    . Holmium laser application Right [id number]    Procedure: HOLMIUM LASER APPLICATION;  Surgeon: Dutch Gray, MD;  Location: [facility];  Service: Urology;  Laterality: Right;  LASER ABLATION OF URETERAL TUMOR  . Robot assited laparoscopic nephroureterectomy Right 01/25/2013    Procedure: ROBOT ASSITED LAPAROSCOPIC NEPHROURETERECTOMY;  Surgeon: Dutch Gray, MD;  Location: [facility];  Service: Urology;  Laterality: Right;  . Transurethral resection of bladder tumor N/A 10/14/2013    Procedure: TRANSURETHRAL RESECTION OF BLADDER TUMOR (TURBT), bladder biopsy, fulgeration;  Surgeon: Dutch Gray, MD;  Location: [facility];  Service: Urology;  Laterality: N/A;  CYSTOSCOPY    . Transurethral resection of bladder tumor N/A 07/28/2014     Procedure: TRANSURETHRAL RESECTION OF BLADDER TUMOR (TURBT) with fulgeration;  Surgeon: Raynelle Bring, MD;  Location: [facility];  Service: Urology;  Laterality: N/A;  . Cystoscopy w/ retrogrades Left 07/28/2014    Procedure: CYSTOSCOPY WITH RETROGRADE PYELOGRAM;  Surgeon: Raynelle Bring, MD;  Location: [facility];  Service: Urology;  Laterality: Left;  Marland Kitchen Mohs surgery  2015    head    Allergies  Allergen Reactions  . Penicillins Itching and Rash    Current Outpatient Prescriptions  Medication Sig Dispense Refill  . acetaminophen (TYLENOL) 500 MG tablet Take 500 mg by mouth every 6 (six) hours as needed for mild pain or moderate pain.    Marland Kitchen amLODipine (NORVASC) 5 MG tablet Take 5 mg by mouth every morning.    Marland Kitchen aspirin (ASPIRIN EC LO-DOSE) 81 MG EC tablet Take 81 mg by mouth at bedtime.     . carvedilol (COREG) 6.25 MG tablet Take 6.25 mg by mouth.    . furosemide (LASIX) 20 MG tablet Take one pill as needed for swelling    . Garlic 10 MG CAPS Take 10 mg by mouth every morning.     Marland Kitchen glipiZIDE (GLUCOTROL XL) 2.5 MG 24 hr tablet Take 2.5 mg by mouth every morning.     Marland Kitchen glucose blood ([employer] BLOOD GLUCOSE TEST STRIPS) test strip 1 each by Other route every morning. Freestyle lite test strips.    Marland Kitchen HYDROcodone-acetaminophen (NORCO/VICODIN) 5-325 MG per tablet Take 1-2 tablets by mouth every 6 (six) hours as needed. (Patient not taking: Reported on 04/21/2015) 25 tablet 0  . ketotifen (ZADITOR) 0.025 % ophthalmic solution Place 1 drop into both eyes 2 (two) times daily as needed (for itching).    Marland Kitchen L-Methylfolate (DEPLIN) 15 MG TABS Take 1 tablet by mouth every morning.    Marland Kitchen LORazepam (ATIVAN) 0.5 MG tablet Take 0.5 mg by mouth daily as needed for anxiety.     . Memantine HCl ER (NAMENDA XR) 28 MG CP24 Take 1 capsule by mouth at bedtime.    . Multiple Vitamins-Minerals (ICAPS AREDS FORMULA PO) Take 1 capsule by mouth every morning.     . Omega-3 Fatty Acids (SM FISH OIL) 1000 MG CAPS Take 1,000 mg by mouth  every morning.     Marland Kitchen PRESCRIPTION MEDICATION Inject 1 each into the skin once a week. Allergy shot. --Mondays--    . ReliOn Ultra Thin Lancets MISC 1 each every morning. FreeStyle Lancets    . risperiDONE (RISPERDAL) 1 MG tablet Take 1 mg by mouth.    . rosuvastatin (CRESTOR) 5 MG tablet Take 5 mg by mouth every morning.     . sitaGLIPtin (JANUVIA) 50 MG tablet Take 50 mg by mouth every morning.     Marland Kitchen  sodium chloride (OCEAN) 0.65 % SOLN nasal spray Place 1-2 sprays into both nostrils daily as needed for congestion.    Marland Kitchen telmisartan-hydrochlorothiazide (MICARDIS HCT) 80-25 MG per tablet Take 0.5 tablets by mouth daily before breakfast.    . venlafaxine XR (EFFEXOR-XR) 150 MG 24 hr capsule Take 150 mg by mouth daily with breakfast.     No current facility-administered medications for this visit.    Review of Systems : See HPI for pertinent positives and negatives.  Physical Examination  Filed Vitals:   09/04/15 1224 09/04/15 1227  BP: 130/66 133/69  Pulse: 49 49  Temp:  98.2 F (36.8 C)  TempSrc:  Oral  Resp:  14  Height:  5\' 6"  (1.676 m)  Weight:  189 lb (85.73 kg)  SpO2:  94%   Body mass index is 30.52 kg/(m^2).   General: WDWN male in NAD GAIT: weak, unable to stand longer than momentarily Eyes: PERRLA, bilateral ectropions   Pulmonary:  Non-labored, CTAB, no rales,  rhonchi, or wheezing.  Cardiac: regular rhythm, no detected murmur.  VASCULAR EXAM Carotid Bruits Right Left   Negative Negative    Aorta is not palpable. Radial pulses are 1+  palpable and equal.                                                                                                                            LE Pulses Right Left       POPLITEAL  not palpable   not palpable       POSTERIOR TIBIAL  fiantly palpable   faintly palpable        DORSALIS PEDIS      ANTERIOR TIBIAL faintly palpable  faintly palpable     Gastrointestinal: soft, nontender, BS WNL, no r/g,  no palpable  masses.  Musculoskeletal: Mild generalized muscle atrophy/wasting. M/S 4/5 throughout, extremities without ischemic changes. Pitting edema in both ankles: 1+ right, trace left.  Neurologic: A&O X 3; Appropriate Affect, Speech is normal CN 2-12 intact, pain and light touch intact in extremities, Motor exam as listed above.   Non-Invasive Vascular Imaging CAROTID DUPLEX 09/04/2015   Right ICA: 60 - 79 % stenosis. Left ICA: <40% stenosis. Bilateral vertebral artery is antegrade. Disease progression in the right ICA when compared to the last carotid duplex on 01/16/12.    Assessment: Marti Ghani is a 81 y.o. male who has no distinct lateralizing neurological symptoms. He has intermittent numbness and weakness in one arm and then the other for the last 2 years, right arm more so than left. He does not have accompanying leg weakness or numbness. He has rare mumbled speech that does not occur at the same time as the arm numbness and this last happened about 3 weeks ago. Wife states pt has been evaluated by Dr. Tish Frederickson, and wife states he was diagnosed with dementia and PTSD. He was in [place] carotid duplex suggests 60-79% right ICA stenosis and <  40% left ICA stenosis. Disease progression in the right ICA when compared to the last carotid duplex on 01/16/12.    I discussed pt HPI, carotid duplex results, and physical exam results with Dr. Trula Slade. Pt has been lost to follow up for 3 years. His recent neurologic symptoms are not clearly lateralizing, and need evaluation by a neurologist within 2 weeks for possible stroke/TIA sx's. Fortunately he has seen Dr. Leta Baptist with diagnoses of dementia and PTSD according to his wife.    Plan:   He needs to be evaluated within 2 weeks by a neurologist in Dr. Nikki Dom office.   Follow-up in 6 months with Carotid Duplex scan.    I discussed in depth with the patient the nature of atherosclerosis, and emphasized the importance of maximal  medical management including strict control of blood pressure, blood glucose, and lipid levels, obtaining regular exercise, and continued cessation of smoking.  The patient is aware that without maximal medical management the underlying atherosclerotic disease process will progress, limiting the benefit of any interventions. The patient was given information about stroke prevention and what symptoms should prompt the patient to seek immediate medical care. Thank you for allowing [place] to participate in this patient's care.  Clemon Chambers, RN, MSN, FNP-C [facility] Office: [phone]  Clinic Physician: Trula Slade  09/04/2015 11:57 AM

## 2015-09-05 ENCOUNTER — Other Ambulatory Visit: Payer: Self-pay

## 2015-09-05 NOTE — Addendum Note (Signed)
Addended by: Dorthula Rue L on: 09/05/2015 01:32 PM   Modules accepted: Orders

## 2015-09-06 ENCOUNTER — Ambulatory Visit (INDEPENDENT_AMBULATORY_CARE_PROVIDER_SITE_OTHER): Payer: Medicare Other | Admitting: Diagnostic Neuroimaging

## 2015-09-06 ENCOUNTER — Encounter: Payer: Self-pay | Admitting: Diagnostic Neuroimaging

## 2015-09-06 VITALS — BP 116/68 | HR 46 | Temp 97.6°F | Ht 68.0 in

## 2015-09-06 DIAGNOSIS — F0390 Unspecified dementia without behavioral disturbance: Secondary | ICD-10-CM

## 2015-09-06 DIAGNOSIS — F4310 Post-traumatic stress disorder, unspecified: Secondary | ICD-10-CM | POA: Diagnosis not present

## 2015-09-06 DIAGNOSIS — F329 Major depressive disorder, single episode, unspecified: Secondary | ICD-10-CM | POA: Diagnosis not present

## 2015-09-06 DIAGNOSIS — G459 Transient cerebral ischemic attack, unspecified: Secondary | ICD-10-CM | POA: Diagnosis not present

## 2015-09-06 DIAGNOSIS — F03A0 Unspecified dementia, mild, without behavioral disturbance, psychotic disturbance, mood disturbance, and anxiety: Secondary | ICD-10-CM

## 2015-09-06 DIAGNOSIS — I6523 Occlusion and stenosis of bilateral carotid arteries: Secondary | ICD-10-CM

## 2015-09-06 DIAGNOSIS — F32A Depression, unspecified: Secondary | ICD-10-CM

## 2015-09-06 NOTE — Patient Instructions (Signed)
Thank you for coming to see [place] at [facility]. I hope we have been able to provide you high quality care today.  You may receive a patient satisfaction survey over the next few weeks. We would appreciate your feedback and comments so that we may continue to improve ourselves and the health of our patients.  - I will setup home palliative care consult.   ~~~~~~~~~~~~~~~~~~~~~~~~~~~~~~~~~~~~~~~~~~~~~~~~~~~~~~~~~~~~~~~~~  DR. PENUMALLI'S GUIDE TO HAPPY AND HEALTHY LIVING These are some of my general health and wellness recommendations. Some of them may apply to you better than others. Please use common sense as you try these suggestions and feel free to ask me any questions.   ACTIVITY/FITNESS Mental, social, emotional and physical stimulation are very important for brain and body health. Try learning a new activity (arts, music, language, sports, games).  Keep moving your body to the best of your abilities.    NUTRITION Eat more plants.  Eat less sugar, salt, preservatives and processed foods.  Avoid toxins such as cigarettes and alcohol.  Drink water when you are thirsty. Warm water with a slice of lemon is an excellent morning drink to start the day.    RELAXATION Consider practicing mindfulness meditation or other relaxation techniques such as deep breathing, prayer, yoga, tai chi, massage.    PLANNING Prepare estate planning, living will, healthcare POA documents. Sometimes this is best planned with the help of an attorney. Theconversationproject.org and agingwithdignity.[email or web address] are excellent resources.
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
## 2015-09-08 ENCOUNTER — Other Ambulatory Visit: Payer: Self-pay

## 2015-09-08 DIAGNOSIS — I6523 Occlusion and stenosis of bilateral carotid arteries: Secondary | ICD-10-CM

## 2016-02-23 ENCOUNTER — Encounter: Payer: Self-pay | Admitting: Family

## 2016-03-04 ENCOUNTER — Ambulatory Visit (HOSPITAL_COMMUNITY): Payer: Medicare Other

## 2016-03-04 ENCOUNTER — Ambulatory Visit: Payer: Medicare Other | Admitting: Family

## 2017-05-21 ENCOUNTER — Other Ambulatory Visit: Payer: Self-pay | Admitting: Radiation Oncology

## 2017-05-23 ENCOUNTER — Other Ambulatory Visit (HOSPITAL_COMMUNITY): Payer: Self-pay | Admitting: Otolaryngology

## 2017-05-23 DIAGNOSIS — R221 Localized swelling, mass and lump, neck: Secondary | ICD-10-CM

## 2017-06-02 ENCOUNTER — Other Ambulatory Visit: Payer: Self-pay | Admitting: Otolaryngology

## 2017-06-02 DIAGNOSIS — R221 Localized swelling, mass and lump, neck: Secondary | ICD-10-CM

## 2017-06-03 ENCOUNTER — Ambulatory Visit (HOSPITAL_COMMUNITY)
Admission: RE | Admit: 2017-06-03 | Discharge: 2017-06-03 | Disposition: A | Discharge status: Home / Self Care | Payer: Medicare Other | Source: Ambulatory Visit | Attending: Otolaryngology | Admitting: Otolaryngology

## 2017-06-03 DIAGNOSIS — R221 Localized swelling, mass and lump, neck: Secondary | ICD-10-CM | POA: Diagnosis present

## 2017-06-03 LAB — POCT I-STAT CREATININE: Creatinine, Ser: 1.4 mg/dL — ABNORMAL HIGH (ref 0.61–1.24)

## 2017-06-03 MED ORDER — IOPAMIDOL (ISOVUE-300) INJECTION 61%
INTRAVENOUS | Status: AC
  Administered 2017-06-03: 75 mL
  Filled 2017-06-03: qty 75

## 2017-06-04 NOTE — Progress Notes (Signed)
Head and Neck Cancer Location of Tumor / Histology:  Right Postauricular/ neck mass was suspicious for squamous cell carcinoma.   Patient presented  months ago with symptoms of:  Dr. Blenda Nicely documents on 05/21/17 Melvin Kent is a 83 y.o. male kindly referred by Nelta Numbers.,* for evaluation of right post-auricular mass. The mass has been present for about one month and has increased in size. He was placed on several rounds of antibiotics without improvement. He is in Hospice due to his multiple medical comorbidities, including dementia, and is here today with his nurse. The mass is tender to palpation. He has had several skin cancers in the past, unknown if these were melanoma. His wife is unfortunately sick and could not accompany him today. The patient, due to his dementia, is unable to provide a significant history other than to say that this region is often tender and that he has a more difficult time hearing on this side. Denies dysphagia, weight loss, neck masses, conchal numbness  -- Family tells me that he has been removed from hospice 03/17/17.  Biopsies revealed:  05/21/17 Final Cytologic Interpretation  Right Postauricular/ neck mass, Fine Needle Aspiration II (smears and thinprep): Suspicious for squamous cell carcinoma. See COMMENT.  Specimen Adequacy:Suboptimal for evaluationtoo thick.  Nutrition Status Yes No Comments  Weight changes? []  [x]    Swallowing concerns? [x]  []  Family reports that several times in the past week, he got choked while eating  PEG? []  [x]     Referrals Yes No Comments  Social Work? []  [x]    Dentistry? []  [x]    Swallowing therapy? []  [x]    Nutrition? []  [x]    Med/Onc? []  [x]     Safety Issues Yes No Comments  Prior radiation? [x]  []  Family slightly unsure. Per Dr. Lynne Logan note from 2014 he got 76 Gy in 2005.  Pacemaker/ICD? []  [x]    Possible current pregnancy? []  [x]    Is the patient on methotrexate? []  [x]      Tobacco/Marijuana/Snuff/ETOH use: He is a former smoker, stopping in 1978. He smoked 2 packs daily for 40 years. He does not drink alcohol.  Past/Anticipated interventions by otolaryngology, if any:  Dr. Lind Guest 05/21/17 Fine Needle Biopsy.   Past/Anticipated interventions by medical oncology, if any:  No  Current Complaints / other details: He has a wound behind his Right ear. He has a gauze in place with serous sanguinous drainage in present. His caregiver reports she changes this dressing about 3 times daily.     BP (!) 164/59   Pulse 64   Temp 98.3 F (36.8 C)   Ht 5\' 8"  (1.727 m)   Wt 178 lb 9.6 oz (81 kg)   SpO2 97% Comment: room air  BMI 27.16 kg/m    Wt Readings from Last 3 Encounters:  06/06/17 178 lb 9.6 oz (81 kg)  09/04/15 189 lb (85.7 kg)  04/21/15 198 lb 11.2 oz (90.1 kg)

## 2017-06-05 ENCOUNTER — Ambulatory Visit (HOSPITAL_COMMUNITY): Payer: Medicare Other

## 2017-06-06 ENCOUNTER — Encounter: Payer: Self-pay | Admitting: Radiation Oncology

## 2017-06-06 ENCOUNTER — Ambulatory Visit
Admission: RE | Admit: 2017-06-06 | Discharge: 2017-06-06 | Disposition: A | Discharge status: Home / Self Care | Payer: Medicare Other | Source: Ambulatory Visit | Attending: Radiation Oncology | Admitting: Radiation Oncology

## 2017-06-06 ENCOUNTER — Encounter: Payer: Self-pay | Admitting: *Deleted

## 2017-06-06 VITALS — BP 164/59 | HR 64 | Temp 98.3°F | Ht 68.0 in | Wt 178.6 lb

## 2017-06-06 DIAGNOSIS — D044 Carcinoma in situ of skin of scalp and neck: Secondary | ICD-10-CM | POA: Insufficient documentation

## 2017-06-06 DIAGNOSIS — Z87891 Personal history of nicotine dependence: Secondary | ICD-10-CM | POA: Diagnosis not present

## 2017-06-06 DIAGNOSIS — C770 Secondary and unspecified malignant neoplasm of lymph nodes of head, face and neck: Secondary | ICD-10-CM

## 2017-06-06 DIAGNOSIS — Z79899 Other long term (current) drug therapy: Secondary | ICD-10-CM | POA: Insufficient documentation

## 2017-06-06 DIAGNOSIS — Z809 Family history of malignant neoplasm, unspecified: Secondary | ICD-10-CM | POA: Insufficient documentation

## 2017-06-06 DIAGNOSIS — Z8249 Family history of ischemic heart disease and other diseases of the circulatory system: Secondary | ICD-10-CM | POA: Diagnosis not present

## 2017-06-06 DIAGNOSIS — Z510 Encounter for antineoplastic radiation therapy: Secondary | ICD-10-CM | POA: Diagnosis present

## 2017-06-06 DIAGNOSIS — Z9889 Other specified postprocedural states: Secondary | ICD-10-CM | POA: Insufficient documentation

## 2017-06-06 NOTE — Progress Notes (Signed)
Radiation Oncology         [phone] ________________________________  Initial Outpatient Consultation  Name: Melvin Kent MRN: [id number]  Date: 06/06/2017  DOB: November 20, 1929  CC:Housecalls, Doctors Making  Marcellino, San Jetty, MD   REFERRING PHYSICIAN: Helayne Seminole, MD  DIAGNOSIS:    ICD-10-CM   1. 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 male who presented with right post-auricular mass that has been present for about one month and has increased in size. He was placed on several rounds of antibiotics without improvement.   Subsequently, the patient saw Dr. Blenda Nicely who ordered biopsy of the mass.  Biopsy of the right postauricular/neck mass on 05/21/2017 revealed: Suspicion for squamous cell carcinoma.  I personally reviewed his imaging. Pertinent imaging thus far includes CT of the neck performed on 06/03/2017 revealing large enhancing soft tissue mass behind the right ear, most likely malignant adenopathy from metastatic disease, possibly related to skin cancer. No pharyngeal mass identified.  Swallowing issues, if any: The patient's family reports that he got choked while eating several times in the past week.  Weight Changes: The patient has lost weight over the past few years but reports no significant unintended weight changes recently. He reports a good appetite.  Pain status: The mass is occasionally tender to palpation and is not painful to him today.  Other symptoms:  Difficulty hearing in the right ear.  Tobacco history, if any: The patient is a former smoker who stopped in 1978. He smoked 2 ppd for 40 years.   ETOH abuse, if any: The patient does not drink alcohol.  Prior cancers, if any: The patient has had several skin cancers in the past:  on the central top of the head and left lip. His family explains that he served in the [employer] for 23 years and has had a substantial amount of sun exposure. He also has a history of colon cancer that was treated surgically and with chemotherapy, and a history of prostate cancer that was treated with radiation.  The patient was previously in [facility] due to his multiple medical comorbidities, including dementia, but was removed on 03/17/2017.  The patient presents today to discuss potential radiotherapy options. He is accompanied by his wife, daughter, and caretaker, and we are joined by Gayleen Orem, our Head and Neck Navigator. Today the mass is covered by gauze due to drainage. His caretaker reports that she changes this dressing about 3 times daily. On review of systems, see above for pertinent positives.  PREVIOUS RADIATION THERAPY: Yes - The patient received radiation for prostate cancer, approximately 76 Gy, in 2005. Dr. Valere Dross was his radiation oncologist, and we are trying to find his records.  PAST MEDICAL HISTORY:  has a past medical history of Anxiety; Arthritis; Bowel incontinence; Cancer ([place]); Carotid artery occlusion; Depression; Diabetes mellitus without complication ([place]); Frequency of urination; GERD (gastroesophageal reflux disease); H/O multiple allergies; History of phlebitis (2 yrs ago); Hyperlipidemia; Hypertension; Mild dementia (04/21/2015); Skin cancer;  Skin cancer of scalp (07/25/2014 ); Sleep apnea; and Ureteral mass.    PAST SURGICAL HISTORY: Past Surgical History:  Procedure Laterality Date  . APPENDECTOMY    . bilateral knee arthroscopies    . CATARACT EXTRACTION     bil  .  COLON SURGERY     colon resection 1994 colon cancer  . CYSTOSCOPY W/ RETROGRADES Left 07/28/2014   Procedure: CYSTOSCOPY WITH RETROGRADE PYELOGRAM;  Surgeon: Raynelle Bring, MD;  Location: [facility];  Service: Urology;  Laterality: Left;  . CYSTOSCOPY WITH RETROGRADE PYELOGRAM, URETEROSCOPY AND STENT PLACEMENT Bilateral 12/03/2012   Procedure: Cystoscopy, Bilateral RetrogradePyelography, Right Ureteroscopy with Ureteral Biopsy,, Right Ureteral Stent placement;  Surgeon: Dutch Gray, MD;  Location: [facility];  Service: Urology;  Laterality: Bilateral;  Cystoscopy, Bilateral RetrogradePyelography, Right Ureteroscopy with Ureteral Biopsy,  Right Ureteral Stent placement    . HERNIA REPAIR     BIL ing hernia repair  . HOLMIUM LASER APPLICATION Right 01/28/3219   Procedure: HOLMIUM LASER APPLICATION;  Surgeon: Dutch Gray, MD;  Location: [facility];  Service: Urology;  Laterality: Right;  LASER ABLATION OF URETERAL TUMOR  . LUMBAR LAMINECTOMY     x 2   . MOHS SURGERY  2015   head  . PROSTATECTOMY     FOR PROSTATE CANCER  . ROBOT ASSITED LAPAROSCOPIC NEPHROURETERECTOMY Right 01/25/2013   Procedure: ROBOT ASSITED LAPAROSCOPIC NEPHROURETERECTOMY;  Surgeon: Dutch Gray, MD;  Location: [facility];  Service: Urology;  Laterality: Right;  . SKIN CANCER EXCISION     NOSE AND LIP  . SKIN CANERS    . TONSILLECTOMY    . TRANSURETHRAL RESECTION OF BLADDER TUMOR N/A 10/14/2013   Procedure: TRANSURETHRAL RESECTION OF BLADDER TUMOR (TURBT), bladder biopsy, fulgeration;  Surgeon: Dutch Gray, MD;  Location: [facility];  Service: Urology;  Laterality: N/A;  CYSTOSCOPY    . TRANSURETHRAL RESECTION OF BLADDER TUMOR N/A 07/28/2014   Procedure: TRANSURETHRAL RESECTION OF BLADDER TUMOR (TURBT) with fulgeration;  Surgeon: Raynelle Bring, MD;  Location: [facility];  Service: Urology;  Laterality: N/A;    FAMILY HISTORY: family history includes Cancer in his father and mother; Coronary artery disease in his brother, brother, and sister.  SOCIAL  HISTORY:  reports that he quit smoking about 40 years ago. His smoking use included Cigarettes. He has a 80.00 pack-year smoking history. He has never used smokeless tobacco. He reports that he does not drink alcohol or use drugs.  ALLERGIES: Morphine and related and Penicillins  MEDICATIONS:  Current Outpatient Prescriptions  Medication Sig Dispense Refill  . acetaminophen (TYLENOL) 500 MG tablet Take 500 mg by mouth every 6 (six) hours as needed for mild pain or moderate pain.    . diazepam (VALIUM) 5 MG tablet Take 5 mg by mouth 2 (two) times daily. Take 1/2 tablet by mouth twice daily    . Emollient (CERAVE) CREA Apply topically.    Marland Kitchen escitalopram (LEXAPRO) 20 MG tablet Take 20 mg by mouth daily.    . feeding supplement (BOOST HIGH PROTEIN) LIQD Take 1 Container by mouth 3 (three) times daily between meals.    . furosemide (LASIX) 20 MG tablet Take one pill as needed for swelling    . glucose blood ([employer] BLOOD GLUCOSE TEST STRIPS) test strip 1 each by Other route every morning. Freestyle lite test strips.    . haloperidol (HALDOL) 1 MG tablet Take 1 mg by mouth every 8 (eight) hours as needed for agitation. Take one tablet by mouth 3 times daily as needed  for agitation    . hydrALAZINE (APRESOLINE) 25 MG tablet Take 50 mg by mouth 3 (three) times daily. Take 50 mg by mouth three times daily.    . hydrOXYzine (ATARAX/VISTARIL) 10 MG tablet Take 10 mg by mouth every 12 (twelve) hours as needed for anxiety (agitation).    Marland Kitchen ipratropium (ATROVENT) 0.03 % nasal spray     . ketoconazole (NIZORAL) 2 % cream Apply 1 application topically 2 (two) times daily. Apply topically to affected area on genital rash twice daily for 14 days until 06/19/17    . ketotifen (ZADITOR) 0.025 % ophthalmic solution Place 1 drop into both eyes 2 (two) times daily as needed (for itching).    . Lactobacillus Acid-Pectin (ACIDOPHILUS/PECTIN) CAPS Take by mouth.    . lamoTRIgine (LAMICTAL) 100 MG tablet Take 100 mg by mouth  daily.    . memantine (NAMENDA) 10 MG tablet Take 10 mg by mouth daily.    . naproxen (NAPROSYN) 500 MG tablet Take 500 mg by mouth 2 (two) times daily with a meal.    . OLANZapine (ZYPREXA) 5 MG tablet Take 5 mg by mouth daily. Dissolve 1/2 tablet (2.5 mg) on the tongue in the morning, and dissolve 1 tablet on the tongue at supper,    . ondansetron (ZOFRAN) 4 MG tablet Take 4 mg by mouth every 8 (eight) hours as needed for nausea or vomiting.    . polycarbophil (FIBERCON) 625 MG tablet Take 625 mg by mouth daily.    . polyethylene glycol (MIRALAX / GLYCOLAX) packet Take 17 g by mouth daily as needed.    . ranitidine (ZANTAC) 300 MG tablet Take 300 mg by mouth at bedtime.    . ReliOn Ultra Thin Lancets MISC 1 each every morning. FreeStyle Lancets    . sitaGLIPtin (JANUVIA) 50 MG tablet Take 50 mg by mouth every morning.     . sodium chloride (OCEAN) 0.65 % SOLN nasal spray Place 1-2 sprays into both nostrils daily as needed for congestion.    . carvedilol (COREG) 6.25 MG tablet Take 6.25 mg by mouth 2 (two) times daily.     Marland Kitchen donepezil (ARICEPT) 10 MG tablet Take 10 mg by mouth daily.    Marland Kitchen PRESCRIPTION MEDICATION Inject 1 each into the skin once a week. Allergy shot. --Mondays--     No current facility-administered medications for this encounter.     REVIEW OF SYSTEMS:  A 10+ POINT REVIEW OF SYSTEMS WAS OBTAINED including neurology, dermatology, psychiatry, cardiac, respiratory, lymph, extremities, GI, GU, Musculoskeletal, constitutional,   HEENT.  All pertinent positives are noted in the HPI.  All others are negative.   PHYSICAL EXAM:  height is 5\' 8"  (1.727 m) and weight is 178 lb 9.6 oz (81 kg). His temperature is 98.3 F (36.8 C). His blood pressure is 164/59 (abnormal) and his pulse is 64. His oxygen saturation is 97%.   General:  in no acute distress. HEENT: Oropharynx is clear.  Neck: Neck is notable for mass that is ulcerating and oozing fluid in the right postauricalar region, about  4.5 cm in greatest dimension. No palpable masses in the remainder of the neck. Heart: Regular in rate and rhythm with no murmurs, rubs, or gallops. Chest: Clear to auscultation bilaterally, with no rhonchi, wheezes, or rales. Abdomen: Soft, nontender, nondistended, with no rigidity or guarding. Normal bowel sounds. Extremities: Edema in ankles bilaterally. Lymphatics: see Neck Exam Skin: Skin of face, scalp and ears show evidence of chronic sun exposure with at  least 3 precancerous lesions noted. The skin on his hands is dry and erythematous. Musculoskeletal: Symmetric strength and muscle tone throughout. In [facility] Neurologic: Coordination is intact. Psychiatric: pleasant to speak with, sx of dementia  ECOG = 3  0 - Asymptomatic (Fully active, able to carry on all predisease activities without restriction)  1 - Symptomatic but completely ambulatory (Restricted in physically strenuous activity but ambulatory and able to carry out work of a light or sedentary nature. For example, light housework, office work)  2 - Symptomatic, <50% in bed during the day (Ambulatory and capable of all self care but unable to carry out any work activities. Up and about more than 50% of waking hours)  3 - Symptomatic, >50% in bed, but not bedbound (Capable of only limited self-care, confined to bed or chair 50% or more of waking hours)  4 - Bedbound (Completely disabled. Cannot carry on any self-care. Totally confined to bed or chair)  5 - Death   Eustace Pen MM, Creech RH, Tormey DC, et al. [phone]). "Toxicity and response criteria of the [facility]". [place] Oncol. 5 (6): 649-55   LABORATORY DATA:  Lab Results  Component Value Date   WBC 7.3 07/25/2014   HGB 12.8 (L) 07/25/2014   HCT 39.8 07/25/2014   MCV 87.7 07/25/2014   PLT 206 07/25/2014   CMP     Component Value Date/Time   NA 139 07/25/2014 0945   K 4.7 07/25/2014 0945   CL 100 07/25/2014 0945   CO2 27 07/25/2014 0945    GLUCOSE 206 (H) 07/25/2014 0945   BUN 26 (H) 07/25/2014 0945   CREATININE 1.40 (H) 06/03/2017 1552   CALCIUM 9.5 07/25/2014 0945   GFRNONAA 49 (L) 07/25/2014 0945   GFRAA 56 (L) 07/25/2014 0945         RADIOGRAPHY: Ct Soft Tissue Neck W Contrast  Result Date: 06/03/2017 CLINICAL DATA:  Neck mass. History of prostate cancer and skin cancers of the face. History of colon cancer 1994 EXAM: CT NECK WITH CONTRAST TECHNIQUE: Multidetector CT imaging of the neck was performed using the standard protocol following the bolus administration of intravenous contrast. CONTRAST:  54mL ISOVUE-300 IOPAMIDOL (ISOVUE-300) INJECTION 61% COMPARISON:  None. FINDINGS: Pharynx and larynx: Normal. No mass or swelling. Salivary glands: Negative for mass or stone. Large soft tissue mass posterior to the right parotid gland appears extra parotid in location but is touching the tail of the parotid. Thyroid: Negative Lymph nodes: Large enhancing soft tissue mass behind the right ear measuring 43 x 34 mm. This extends to the mastoid tip. No bony erosion. No invasion of the external auditory canal. Probable enlarged malignant lymph node. No other malignant adenopathy in the neck Vascular: Carotid artery atherosclerotic disease bilaterally. Limited intracranial: Negative Visualized orbits: Bilateral lens replacement. Mastoids and visualized paranasal sinuses: Mucosal edema in the paranasal sinuses. Contraction and opacification left maxillary sinus Skeleton: Cervical spine degenerative change. Cervical fusion C5-6 and C6-7. No acute skeletal abnormality. Upper chest: Lung apices clear. Other: None IMPRESSION: Large enhancing soft tissue mass behind the right ear most likely malignant adenopathy from metastatic disease. Possibly related to skin cancer. No pharyngeal mass identified. Direct mucosal evaluation recommended to exclude pharyngeal mucosal lesion. Electronically Signed   By: Franchot Gallo M.D.   On: 06/03/2017 20:25        IMPRESSION/PLAN: This is a delightful patient with skin cancer / head and neck cancer. Given the patient's age and comorbidities, he is not an  ideal candidate for surgical intervention as the risks are greater than the benefits as discussed with ENT. However I do recommend localized radiotherapy for this patient to the mass on the right side of his neck.  We discussed the potential risks, benefits, and side effects of palliative radiotherapy over 4 weeks with electrons. We talked in detail about acute and late effects.  No guarantees of treatment were given. A consent form was signed by the patient's wife and placed in the patient's medical record. The patient is enthusiastic about proceeding with treatment. I look forward to participating in the patient's care.    Simulation (treatment planning) will be scheduled for Tuesday 06/10/2017 or Wednesday 06/11/2017. The patient's wife or daughter will be called later to set up the appointment time.  We also discussed that the treatment of head and neck cancer is a multidisciplinary process to maximize treatment outcomes and quality of life. For this reasons the following referrals have been or will be made:   Speech language pathology for swallowing therapy: I recommended that the patient see a swallowing therapist if one is available at [facility] care. - we can arrange here if needed - they will let us know.   __________________________________________   [name], MD   This document serves as a record of services personally performed by [name], MD. It was created on her behalf by Rae Lips, a trained medical scribe. The creation of this record is based on the scribe's personal observations and the provider's statements to them. This document has been checked and approved by the attending provider.

## 2017-06-09 NOTE — Progress Notes (Signed)
Oncology Nurse Navigator Documentation  Met with Mr. Savo during initial consult with Dr. Isidore Moos.  He was accompanied by his wife, dtr, and caregiver.  He arrived in [facility] 1. Introduced myself as his Navigator, explained my role as a member of the Care Team.   2. Provided New Patient Information packet, discussed contents:  Contact information for physician(s), myself, other members of the Care Team.  Advance Directive information ([place] blue pamphlet with LCSW contact info). Wife indicated copies in MR.  Fall Prevention Patient Safety Plan  Appointment Guideline  Financial Assistance Information sheet  [place] with highlight of [place] 3. Of Note:    Lives with 5 other residents in a home which is part of a 3-house care facility called [facility].  Wheelchair bound.  Edentulous. 4. Reported choking episodes.  Caregiver to check re availability of SLP services and contact me. 5. Provided introductory explanation of radiation treatment including SIM planning and purpose of Aquaplast head and shoulder mask, showed them example.   6. I encouraged them to contact me with questions/concerns as treatments/procedures begin.  They verbalized understanding of information provided.    Gayleen Orem, RN, BSN, [place] Neck Oncology Nurse [place] at [place] [phone]

## 2017-06-10 ENCOUNTER — Ambulatory Visit
Admission: RE | Admit: 2017-06-10 | Discharge: 2017-06-10 | Disposition: A | Discharge status: Home / Self Care | Payer: Medicare Other | Source: Ambulatory Visit | Attending: Radiation Oncology | Admitting: Radiation Oncology

## 2017-06-10 ENCOUNTER — Encounter: Payer: Self-pay | Admitting: *Deleted

## 2017-06-10 DIAGNOSIS — Z510 Encounter for antineoplastic radiation therapy: Secondary | ICD-10-CM | POA: Diagnosis not present

## 2017-06-10 DIAGNOSIS — C770 Secondary and unspecified malignant neoplasm of lymph nodes of head, face and neck: Secondary | ICD-10-CM

## 2017-06-10 NOTE — Progress Notes (Signed)
Head and Neck Cancer Simulation, IMRT treatment planning, and Special treatment procedure note   Outpatient  Diagnosis:    ICD-10-CM   1. 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 Right ear was taped away from tumor. Bolus was placed.  An Aquaplast head and shoulder mask was custom fitted to the patient's anatomy. High-resolution CT axial imaging was obtained of the head and neck . I verified that the quality of the imaging is good for treatment planning. 1 Medically Necessary Treatment Device was fabricated and supervised by me: Aquaplast mask.   Treatment planning note I plan to treat the patient with electrons.  I plan to treat the patient's tumor to a total dose of 50 Gray in 20  Fractions with 76mm bolus daily. Electron monte carlo/special port plan was ordered from dosimetry.   -----------------------------------  [name], MD

## 2017-06-10 NOTE — Progress Notes (Signed)
Oncology Nurse Navigator Documentation  To provide support, encouragement and care continuity, met with Melvin Kent during his CT SIM.  He was accompanied by his wife and caregiver.  I explained again registration procedure for RadOnc, they voiced understanding.  He tolerated procedure without difficulty.   RTT provided several copies of RT schedule noting start of treatment next Monday 9/24 1730.  Following SIM, I provided tour of LIN[facility] 3 treatment area, explained arrival/preparation procedures.  They voiced understanding of information.  Caregiver confirmed SLP services available at patient's care facility.  Dr. Isidore Moos wrote order on Patient Visit form provided by caregiver. I encouraged them to call me with questions/concerns.  Gayleen Orem, RN, BSN, [place] Neck Oncology Nurse [place] at [place] [phone]

## 2017-06-11 ENCOUNTER — Telehealth: Payer: Self-pay | Admitting: *Deleted

## 2017-06-11 NOTE — Telephone Encounter (Signed)
Oncology Nurse Navigator Documentation  Received call from patient's dtr Debbie Billodeaux.  She requested her dad's RT schedule so she can coordinate transportation with Geanie Logan, owner and coordinator of the [facility] group home in [place] where her dad now lives.    She noted Ms. Lowell Guitar will arrange medical transport for all appts and the plan is to have a family member or an [facility] staffperson accompany her dad each visit. I sent e-mail with appt information per her request, encouraged further contact with questions/concerns as treatments proceed. Added her and Ms. Hatfield's contact information to Demographics.  Gayleen Orem, RN, BSN, [place] Neck Oncology Nurse [place] at [place] [phone]

## 2017-06-13 DIAGNOSIS — Z510 Encounter for antineoplastic radiation therapy: Secondary | ICD-10-CM | POA: Diagnosis not present

## 2017-06-16 ENCOUNTER — Ambulatory Visit
Admission: RE | Admit: 2017-06-16 | Discharge: 2017-06-16 | Disposition: A | Discharge status: Home / Self Care | Payer: Medicare Other | Source: Ambulatory Visit | Attending: Radiation Oncology | Admitting: Radiation Oncology

## 2017-06-16 DIAGNOSIS — Z510 Encounter for antineoplastic radiation therapy: Secondary | ICD-10-CM | POA: Diagnosis not present

## 2017-06-16 DIAGNOSIS — C770 Secondary and unspecified malignant neoplasm of lymph nodes of head, face and neck: Secondary | ICD-10-CM

## 2017-06-16 MED ORDER — BIAFINE EX EMUL: CUTANEOUS | Status: DC | PRN

## 2017-06-16 NOTE — Progress Notes (Signed)
Pt here for patient teaching.  Pt given Radiation and You booklet, Managing Acute Radiation Side Effects for Head and Neck Cancer handout, skin care instructions and Sonafine.  Reviewed areas of pertinence such as fatigue, hair loss and skin changes . Pt able to give teach back of to pat skin, use unscented/gentle soap and drink plenty of water,apply Sonafine bid, avoid applying anything to skin within 4 hours of treatment and to use an electric razor if they must shave. Pt verbalizes understanding of information given and will contact nursing with any questions or concerns.     Http://rtanswers.org/treatmentinformation/whattoexpect/index

## 2017-06-17 ENCOUNTER — Ambulatory Visit
Admission: RE | Admit: 2017-06-17 | Discharge: 2017-06-17 | Disposition: A | Discharge status: Home / Self Care | Payer: Medicare Other | Source: Ambulatory Visit | Attending: Radiation Oncology | Admitting: Radiation Oncology

## 2017-06-17 DIAGNOSIS — Z510 Encounter for antineoplastic radiation therapy: Secondary | ICD-10-CM | POA: Diagnosis not present

## 2017-06-18 ENCOUNTER — Ambulatory Visit
Admission: RE | Admit: 2017-06-18 | Discharge: 2017-06-18 | Disposition: A | Discharge status: Home / Self Care | Payer: Medicare Other | Source: Ambulatory Visit | Attending: Radiation Oncology | Admitting: Radiation Oncology

## 2017-06-18 DIAGNOSIS — Z510 Encounter for antineoplastic radiation therapy: Secondary | ICD-10-CM | POA: Diagnosis not present

## 2017-06-19 ENCOUNTER — Ambulatory Visit
Admission: RE | Admit: 2017-06-19 | Discharge: 2017-06-19 | Disposition: A | Discharge status: Home / Self Care | Payer: Medicare Other | Source: Ambulatory Visit | Attending: Radiation Oncology | Admitting: Radiation Oncology

## 2017-06-19 DIAGNOSIS — Z510 Encounter for antineoplastic radiation therapy: Secondary | ICD-10-CM | POA: Diagnosis not present

## 2017-06-20 ENCOUNTER — Ambulatory Visit
Admission: RE | Admit: 2017-06-20 | Discharge: 2017-06-20 | Disposition: A | Discharge status: Home / Self Care | Payer: Medicare Other | Source: Ambulatory Visit | Attending: Radiation Oncology | Admitting: Radiation Oncology

## 2017-06-20 DIAGNOSIS — Z510 Encounter for antineoplastic radiation therapy: Secondary | ICD-10-CM | POA: Diagnosis not present

## 2017-06-23 ENCOUNTER — Ambulatory Visit
Admission: RE | Admit: 2017-06-23 | Discharge: 2017-06-23 | Disposition: A | Discharge status: Home / Self Care | Payer: Medicare Other | Source: Ambulatory Visit | Attending: Radiation Oncology | Admitting: Radiation Oncology

## 2017-06-23 DIAGNOSIS — Z510 Encounter for antineoplastic radiation therapy: Secondary | ICD-10-CM | POA: Diagnosis not present

## 2017-06-24 ENCOUNTER — Ambulatory Visit
Admission: RE | Admit: 2017-06-24 | Discharge: 2017-06-24 | Disposition: A | Discharge status: Home / Self Care | Payer: Medicare Other | Source: Ambulatory Visit | Attending: Radiation Oncology | Admitting: Radiation Oncology

## 2017-06-24 DIAGNOSIS — Z510 Encounter for antineoplastic radiation therapy: Secondary | ICD-10-CM | POA: Diagnosis not present

## 2017-06-25 ENCOUNTER — Ambulatory Visit
Admission: RE | Admit: 2017-06-25 | Discharge: 2017-06-25 | Disposition: A | Discharge status: Home / Self Care | Payer: Medicare Other | Source: Ambulatory Visit | Attending: Radiation Oncology | Admitting: Radiation Oncology

## 2017-06-25 DIAGNOSIS — Z510 Encounter for antineoplastic radiation therapy: Secondary | ICD-10-CM | POA: Diagnosis not present

## 2017-06-26 ENCOUNTER — Ambulatory Visit
Admission: RE | Admit: 2017-06-26 | Discharge: 2017-06-26 | Disposition: A | Discharge status: Home / Self Care | Payer: Medicare Other | Source: Ambulatory Visit | Attending: Radiation Oncology | Admitting: Radiation Oncology

## 2017-06-26 DIAGNOSIS — Z510 Encounter for antineoplastic radiation therapy: Secondary | ICD-10-CM | POA: Diagnosis not present

## 2017-06-27 ENCOUNTER — Encounter: Payer: Self-pay | Admitting: *Deleted

## 2017-06-27 ENCOUNTER — Ambulatory Visit
Admission: RE | Admit: 2017-06-27 | Discharge: 2017-06-27 | Disposition: A | Discharge status: Home / Self Care | Payer: Medicare Other | Source: Ambulatory Visit | Attending: Radiation Oncology | Admitting: Radiation Oncology

## 2017-06-27 DIAGNOSIS — Z510 Encounter for antineoplastic radiation therapy: Secondary | ICD-10-CM | POA: Diagnosis not present

## 2017-06-27 NOTE — Progress Notes (Signed)
[place] Psychosocial Distress Screening Clinical Social Work  Clinical Social Work was referred by distress screening protocol.  The patient scored a 8 on the Psychosocial Distress Thermometer which indicates severe distress. Clinical Social Worker attempted to contact patient to assess for distress and other psychosocial needs. CSW talked with patient's spouse.  Mrs. Lubbers reported the patient has been very tired and "not like himself", but she attributes some of these changes possibly to his dementia.  CSW focused remainder of conversation on patient's spouse and provided brief emotional support.  CSW encouraged patient's spouse to follow up with CSW as needed.  ONCBCN DISTRESS SCREENING 06/06/2017  Screening Type Initial Screening  Distress experienced in past week (1-10) 8  Practical problem type Housing;Work/school;Transportation  Family Problem type Partner  Emotional problem type Depression;Nervousness/Anxiety;Isolation/feeling alone;Feeling hopeless;Boredom  Spiritual/Religous concerns type Loss of sense of purpose  Physical Problem type Pain;Sleep/insomnia;Getting around;Bathing/dressing;Constipation/diarrhea;Skin dry/itchy    Maryjean Morn, MSW, LCSW, OSW-C Clinical Social Worker [facility] [phone]

## 2017-06-30 ENCOUNTER — Ambulatory Visit
Admission: RE | Admit: 2017-06-30 | Discharge: 2017-06-30 | Disposition: A | Discharge status: Home / Self Care | Payer: Medicare Other | Source: Ambulatory Visit | Attending: Radiation Oncology | Admitting: Radiation Oncology

## 2017-06-30 ENCOUNTER — Encounter: Payer: Self-pay | Admitting: *Deleted

## 2017-06-30 DIAGNOSIS — Z510 Encounter for antineoplastic radiation therapy: Secondary | ICD-10-CM | POA: Diagnosis not present

## 2017-07-01 ENCOUNTER — Ambulatory Visit
Admission: RE | Admit: 2017-07-01 | Discharge: 2017-07-01 | Disposition: A | Discharge status: Home / Self Care | Payer: Medicare Other | Source: Ambulatory Visit | Attending: Radiation Oncology | Admitting: Radiation Oncology

## 2017-07-01 DIAGNOSIS — Z510 Encounter for antineoplastic radiation therapy: Secondary | ICD-10-CM | POA: Diagnosis not present

## 2017-07-01 NOTE — Progress Notes (Signed)
Oncology Nurse Navigator Documentation  To provide support, encouragement and care continuity, met with Mr. Seibert s/p RT during WUT with Dr. Isidore Moos.  He was accompanied by his wife, dtr and caregiver.  He completed 11/20 RT today. Provided Epic appt calendar for remaining appts. They voiced understanding to call me with needs/concerns.  Gayleen Orem, RN, BSN, [place] Neck Oncology Nurse [place] at [place] [phone]

## 2017-07-02 ENCOUNTER — Ambulatory Visit
Admission: RE | Admit: 2017-07-02 | Discharge: 2017-07-02 | Disposition: A | Discharge status: Home / Self Care | Payer: Medicare Other | Source: Ambulatory Visit | Attending: Radiation Oncology | Admitting: Radiation Oncology

## 2017-07-02 DIAGNOSIS — Z510 Encounter for antineoplastic radiation therapy: Secondary | ICD-10-CM | POA: Diagnosis not present

## 2017-07-03 ENCOUNTER — Ambulatory Visit
Admission: RE | Admit: 2017-07-03 | Discharge: 2017-07-03 | Disposition: A | Discharge status: Home / Self Care | Payer: Medicare Other | Source: Ambulatory Visit | Attending: Radiation Oncology | Admitting: Radiation Oncology

## 2017-07-03 DIAGNOSIS — Z510 Encounter for antineoplastic radiation therapy: Secondary | ICD-10-CM | POA: Diagnosis not present

## 2017-07-04 ENCOUNTER — Ambulatory Visit
Admission: RE | Admit: 2017-07-04 | Discharge: 2017-07-04 | Disposition: A | Discharge status: Home / Self Care | Payer: Medicare Other | Source: Ambulatory Visit | Attending: Radiation Oncology | Admitting: Radiation Oncology

## 2017-07-04 DIAGNOSIS — Z510 Encounter for antineoplastic radiation therapy: Secondary | ICD-10-CM | POA: Diagnosis not present

## 2017-07-07 ENCOUNTER — Ambulatory Visit
Admission: RE | Admit: 2017-07-07 | Discharge: 2017-07-07 | Disposition: A | Discharge status: Home / Self Care | Payer: Medicare Other | Source: Ambulatory Visit | Attending: Radiation Oncology | Admitting: Radiation Oncology

## 2017-07-07 DIAGNOSIS — Z510 Encounter for antineoplastic radiation therapy: Secondary | ICD-10-CM | POA: Diagnosis not present

## 2017-07-08 ENCOUNTER — Ambulatory Visit
Admission: RE | Admit: 2017-07-08 | Discharge: 2017-07-08 | Disposition: A | Discharge status: Home / Self Care | Payer: Medicare Other | Source: Ambulatory Visit | Attending: Radiation Oncology | Admitting: Radiation Oncology

## 2017-07-08 DIAGNOSIS — Z510 Encounter for antineoplastic radiation therapy: Secondary | ICD-10-CM | POA: Diagnosis not present

## 2017-07-09 ENCOUNTER — Ambulatory Visit
Admission: RE | Admit: 2017-07-09 | Discharge: 2017-07-09 | Disposition: A | Discharge status: Home / Self Care | Payer: Medicare Other | Source: Ambulatory Visit | Attending: Radiation Oncology | Admitting: Radiation Oncology

## 2017-07-09 DIAGNOSIS — Z510 Encounter for antineoplastic radiation therapy: Secondary | ICD-10-CM | POA: Diagnosis not present

## 2017-07-10 ENCOUNTER — Ambulatory Visit
Admission: RE | Admit: 2017-07-10 | Discharge: 2017-07-10 | Disposition: A | Discharge status: Home / Self Care | Payer: Medicare Other | Source: Ambulatory Visit | Attending: Radiation Oncology | Admitting: Radiation Oncology

## 2017-07-10 DIAGNOSIS — Z510 Encounter for antineoplastic radiation therapy: Secondary | ICD-10-CM | POA: Diagnosis not present

## 2017-07-11 ENCOUNTER — Encounter: Payer: Self-pay | Admitting: Radiation Oncology

## 2017-07-11 ENCOUNTER — Ambulatory Visit
Admission: RE | Admit: 2017-07-11 | Discharge: 2017-07-11 | Disposition: A | Discharge status: Home / Self Care | Payer: Medicare Other | Source: Ambulatory Visit | Attending: Radiation Oncology | Admitting: Radiation Oncology

## 2017-07-11 DIAGNOSIS — Z510 Encounter for antineoplastic radiation therapy: Secondary | ICD-10-CM | POA: Diagnosis not present

## 2017-07-16 NOTE — Progress Notes (Signed)
  Radiation Oncology         (336) [phone] ________________________________  Name: Melvin Kent MRN: [id number]  Date: 07/11/2017  DOB: 1930/02/05  End of Treatment Note  Diagnosis:   Secondary malignancy of lymph nodes of head, face and neck ([place]) Staging form: Cutaneous Carcinoma of the Head and Neck, AJCC 8th Edition - Clinical: Stage IV (cTX, cN3b, cM0) - Signed by [name], MD on 06/06/2017  Indication for treatment:  palliative       Radiation treatment dates:   06/16/2017 to 07/11/2017  Site/dose:   The Right postaurical was treated to 50 Gy in 20 fractions of 2.5 Gy.  Beams/energy:   En Face // 18E  Narrative: The patient tolerated radiation treatment relatively well.   The patient's tumor flattened with treatment. The patient's caregiver reported increased drainage, yellow in color from his wound site. He experienced fatigue with treatment but no fevers.   Plan: The patient has completed radiation treatment. I encouraged the patient's cargivers to go w/ him  to [facility] ED if fever develops above 100.5 F. I also encouraged his caregivers to continue to keep the tumor site clean and bandaged. The patient will return to radiation oncology clinic for routine followup in one month. I advised them to call or return sooner if they have any questions or concerns related to their recovery or treatment.  -----------------------------------  [name], MD   This document serves as a record of services personally performed by [name], MD. It was created on her behalf by Arlyce Harman, a trained medical scribe. The creation of this record is based on the scribe's personal observations and the provider's statements to them. This document has been checked and approved by the attending provider.

## 2017-07-21 ENCOUNTER — Telehealth: Payer: Self-pay

## 2017-07-21 NOTE — Telephone Encounter (Signed)
Melvin Kent's caregiver called today. She reported new symptoms that Melvin Kent was having. He has developed swelling, several hard nodules, and drainage from his previous radiation site and right ear. I notified Dr. Isidore Moos of these symtoms and she requested that he come in for a follow up at her next available appointment this week. He has been scheduled for Friday 07/25/17 at 11:40. I have called his caregiver with this new appointment and she agreed.

## 2017-07-23 ENCOUNTER — Encounter: Payer: Self-pay | Admitting: Radiation Oncology

## 2017-07-23 NOTE — Progress Notes (Signed)
Mr. Tiggs presents for follow up of radiation completed 07/11/17 to his Right Aurical.    Pain issues, if any: His wife reports he appears to have pain when moving his left leg due to a wound that is present. She states that his Right ear does not appear to be painful to him.  Using a feeding tube?: No Weight changes, if any:  Swallowing issues, if any: His wife reports that he is eating about 2 meals daily along with snacks in between meals.  Smoking or chewing tobacco? No Using fluoride trays daily? No Last ENT visit was on: no Other notable issues, if any:  He has developed increased drainage from his radiation site, right ear, and new nodules to his radiation site. He is currently taking an antibiotic prescribed by Dr. Keenan Bachelor at his facility.  He currently has a dressing in place with yellow/green drainage present. He has drainage present in his Right ear.  He is very sleepy and per his wife sleeps often at his facility.   \BP 114/70   Pulse (!) 107   Temp 98 F (36.7 C)   SpO2 100% Comment: room air

## 2017-07-25 ENCOUNTER — Ambulatory Visit
Admission: RE | Admit: 2017-07-25 | Discharge: 2017-07-25 | Disposition: A | Discharge status: Home / Self Care | Payer: Medicare Other | Source: Ambulatory Visit | Attending: Radiation Oncology | Admitting: Radiation Oncology

## 2017-07-25 ENCOUNTER — Encounter: Payer: Self-pay | Admitting: Radiation Oncology

## 2017-07-25 ENCOUNTER — Telehealth: Payer: Self-pay | Admitting: *Deleted

## 2017-07-25 ENCOUNTER — Encounter: Payer: Self-pay | Admitting: *Deleted

## 2017-07-25 ENCOUNTER — Telehealth: Payer: Self-pay

## 2017-07-25 VITALS — BP 114/70 | HR 107 | Temp 98.0°F

## 2017-07-25 DIAGNOSIS — Z791 Long term (current) use of non-steroidal anti-inflammatories (NSAID): Secondary | ICD-10-CM | POA: Diagnosis not present

## 2017-07-25 DIAGNOSIS — Z885 Allergy status to narcotic agent status: Secondary | ICD-10-CM | POA: Diagnosis not present

## 2017-07-25 DIAGNOSIS — Z923 Personal history of irradiation: Secondary | ICD-10-CM | POA: Diagnosis not present

## 2017-07-25 DIAGNOSIS — Z79899 Other long term (current) drug therapy: Secondary | ICD-10-CM | POA: Diagnosis not present

## 2017-07-25 DIAGNOSIS — C770 Secondary and unspecified malignant neoplasm of lymph nodes of head, face and neck: Secondary | ICD-10-CM

## 2017-07-25 DIAGNOSIS — Z880 Allergy status to penicillin: Secondary | ICD-10-CM | POA: Insufficient documentation

## 2017-07-25 HISTORY — DX: Personal history of irradiation: Z92.3

## 2017-07-25 NOTE — Telephone Encounter (Signed)
Dr. Isidore Moos requested I call Melvin Kent's home care faciltiy to provide information regarding his care, and determine his current antibiotic. I have left a message with the owner and the office manager and asked for a return phone. I left my direct contact number on both voice mails.

## 2017-07-25 NOTE — Progress Notes (Signed)
Oncology Nurse Navigator Documentation  Flowsheet update.  Rick Diehl, RN, BSN, CHPN Head & Neck Oncology Nurse Navigator [facility] at [place] [phone]  

## 2017-07-25 NOTE — Progress Notes (Signed)
Radiation Oncology         [phone] ________________________________  Name: Melvin Kent MRN: [id number]  Date: 07/25/2017  DOB: [birth year]  Follow-Up Visit Note  CC: [facility]  Helayne Seminole, MD  Diagnosis and Prior Radiotherapy:       ICD-10-CM   1. 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  CHIEF COMPLAINT:  Here for follow-up and surveillance of head and neck cancer  Narrative:  The patient returns today for routine follow-up of radiation completed 07/11/2017 to his right aurical. He is accompanied by his wife and son.  Pain issues, if any: His wife reports he appears to have pain when moving his left leg due to a wound that is present. She states that his right ear does not appear to be painful to him.   Using a feeding tube?: No.  Weight changes, if any: None.  Swallowing issues, if any: His wife reports that he is eating about 2 meals daily along with snacks in between meals.   Smoking or chewing tobacco? No.   Last ENT visit was on: 06/04/2017  Other notable issues, if any: He has developed increased drainage from his radiation site, right ear, and new nodules to his radiation site. He is currently taking an antibiotic prescribed by Dr. Keenan Bachelor at his facility. He currently has a dressing in place with yellow/green drainage present. He has drainage present in his right ear. He is very sleepy and per his wife sleeps often at his facility.                    ALLERGIES:  is allergic to morphine and related and penicillins.  Meds: Current  Outpatient Prescriptions  Medication Sig Dispense Refill  . acetaminophen (TYLENOL) 500 MG tablet Take 500 mg by mouth every 6 (six) hours as needed for mild pain or moderate pain.    . diazepam (VALIUM) 5 MG tablet Take 5 mg by mouth 2 (two) times daily. Take 1/2 tablet by mouth twice daily    . Emollient (CERAVE) CREA Apply topically.    Marland Kitchen escitalopram (LEXAPRO) 20 MG tablet Take 20 mg by mouth daily.    . feeding supplement (BOOST HIGH PROTEIN) LIQD Take 1 Container by mouth 3 (three) times daily between meals.    . furosemide (LASIX) 20 MG tablet Take one pill as needed for swelling    . glucose blood ([employer] BLOOD GLUCOSE TEST STRIPS) test strip 1 each by Other route every morning. Freestyle lite test strips.    . haloperidol (HALDOL) 1 MG tablet Take 1 mg by mouth every 8 (eight) hours as needed for agitation. Take one tablet by mouth 3 times daily as needed for agitation    . hydrALAZINE (APRESOLINE) 25 MG tablet Take 50 mg by mouth 3 (three) times daily. Take 50 mg by mouth three times daily.    . hydrOXYzine (ATARAX/VISTARIL) 10 MG tablet Take 10 mg by mouth every 12 (twelve) hours as needed for anxiety (agitation).    Marland Kitchen ipratropium (ATROVENT) 0.03 % nasal  spray     . ketoconazole (NIZORAL) 2 % cream Apply 1 application topically 2 (two) times daily. Apply topically to affected area on genital rash twice daily for 14 days until 06/19/17    . ketotifen (ZADITOR) 0.025 % ophthalmic solution Place 1 drop into both eyes 2 (two) times daily as needed (for itching).    . Lactobacillus Acid-Pectin (ACIDOPHILUS/PECTIN) CAPS Take by mouth.    . lamoTRIgine (LAMICTAL) 100 MG tablet Take 100 mg by mouth daily.    . memantine (NAMENDA) 10 MG tablet Take 10 mg by mouth daily.    . naproxen (NAPROSYN) 500 MG tablet Take 500 mg by mouth 2 (two) times daily with a meal.    . OLANZapine (ZYPREXA) 5 MG tablet Take 5 mg by mouth daily. Dissolve 1/2 tablet (2.5 mg) on the tongue in the morning, and dissolve 1  tablet on the tongue at supper,    . ondansetron (ZOFRAN) 4 MG tablet Take 4 mg by mouth every 8 (eight) hours as needed for nausea or vomiting.    . polycarbophil (FIBERCON) 625 MG tablet Take 625 mg by mouth daily.    . polyethylene glycol (MIRALAX / GLYCOLAX) packet Take 17 g by mouth daily as needed.    Marland Kitchen PRESCRIPTION MEDICATION Inject 1 each into the skin once a week. Allergy shot. --Mondays--    . ranitidine (ZANTAC) 300 MG tablet Take 300 mg by mouth at bedtime.    . ReliOn Ultra Thin Lancets MISC 1 each every morning. FreeStyle Lancets    . sitaGLIPtin (JANUVIA) 50 MG tablet Take 50 mg by mouth every morning.     . sodium chloride (OCEAN) 0.65 % SOLN nasal spray Place 1-2 sprays into both nostrils daily as needed for congestion.    . carvedilol (COREG) 6.25 MG tablet Take 6.25 mg by mouth 2 (two) times daily.     Marland Kitchen donepezil (ARICEPT) 10 MG tablet Take 10 mg by mouth daily.     No current facility-administered medications for this encounter.     Physical Findings: Wt Readings from Last 3 Encounters:  06/06/17 178 lb 9.6 oz (81 kg)  09/04/15 189 lb (85.7 kg)  04/21/15 198 lb 11.2 oz (90.1 kg)    temperature is 98 F (36.7 C). His blood pressure is 114/70 and his pulse is 107 (abnormal). His oxygen saturation is 100%.  General: Alert and oriented, in no acute distress. HEENT: The tumor behind his right ear has flattened but has a necrotic hole and foul odor. He has erythema of the skin around the tumor. He has some drainage in the external right ear canal, and his external ear is erythematous and crusted. Musculoskeletal: Presents in a wheelchair today.     Lab Findings: Lab Results  Component Value Date   WBC 7.3 07/25/2014   HGB 12.8 (L) 07/25/2014   HCT 39.8 07/25/2014   MCV 87.7 07/25/2014   PLT 206 07/25/2014    Lab Results  Component Value Date   TSH 2.500 08/10/2014    Radiographic Findings: No results found.  Impression/Plan:    1) Head and Neck Cancer  Status: It seems that he has an infection at the site of the tumor. We're going to do a wound culture today to see if there is bacteria / see sensitivity. The patient's family knows that if he develops a fever over 100.5 to take him to the ED. I will also arrange a follow-up appointment with Dr. Blenda Nicely.  Anderson Malta RN will call  nursing home to find out what ABX he is taking.  2) Nutritional Status: He is eating about 2 meals daily along with snacks in between meals. PEG tube: N/A  3) Risk Factors: The patient has been educated about risk factors including alcohol and tobacco abuse; they understand that avoidance of alcohol and tobacco is important to prevent recurrences as well as other cancers  4) Swallowing: No issues.  5) Dental: Encouraged to continue regular followup with dentistry, and dental hygiene including fluoride rinses.  6) Thyroid function:  Lab Results  Component Value Date   TSH 2.500 08/10/2014    7) Other: Follow up with Dr. Blenda Nicely.  8) Follow-up in 3 months. The family was encouraged to call with any issues or questions before then.    _____________________________________   [name], MD  This document serves as a record of services personally performed by [name], MD. It was created on her behalf by Rae Lips, a trained medical scribe. The creation of this record is based on the scribe's personal observations and the provider's statements to them. This document has been checked and approved by the attending provider.

## 2017-07-25 NOTE — Telephone Encounter (Signed)
CALLED PATIENT TO INFORM OF FU APPT. WITH DR. Isidore Moos ON 10-31-17 @ 11 AM, LVM FOR A RETURN CALL

## 2017-07-28 LAB — WOUND CULTURE

## 2017-07-29 ENCOUNTER — Telehealth: Payer: Self-pay | Admitting: *Deleted

## 2017-07-29 NOTE — Telephone Encounter (Signed)
Oncology Nurse Navigator Documentation  Per Dr. Pearlie Oyster guidance, called [facility] to arrange appointment.  Spoke with Anitra Lauth, requested patient be contacted and routine apt arranged in 10-12 days with Dr. Scherrie Gerlach to evaluate response of tumor infection to abx.  She voiced understanding.  Sent Dr. Scherrie Gerlach secure email with specifics re patient's 11/2 follow-up appt, would culture results, and indication of appt request.  Gayleen Orem, RN, BSN, [place] [facility] [place] [phone]

## 2017-08-01 ENCOUNTER — Telehealth: Payer: Self-pay

## 2017-08-01 ENCOUNTER — Other Ambulatory Visit: Payer: Self-pay | Admitting: Radiation Oncology

## 2017-08-01 MED ORDER — SULFAMETHOXAZOLE-TRIMETHOPRIM 800-160 MG PO TABS: 1.0000 | ORAL_TABLET | Freq: Two times a day (BID) | ORAL | 0 refills | Status: DC

## 2017-08-01 NOTE — Telephone Encounter (Signed)
I called and spoke to Melvin Kent (owner of the facility, [facility] where Melvin Kent lives) on 07/28/17. I requested information on the antibiotic that he was receiving for his wound infection behind his right ear. She informed me that he was taking cipro 500 mg twice daily. I was able to fax the results and sensitivities of the wound culture that I performed on 07/25/17 to her facility that day. She told me that Melvin Kent's physician was coming to the facility on 07/29/17 and would be able to prescribe another antibiotic based on the wound culture results. I called today to receive an update, but was only able to leave a voice message for a return call.

## 2017-08-01 NOTE — Telephone Encounter (Signed)
I called back again having not received a call back from the facility where Mr. Handlin lives. I was able to speak to [facility] the owner and Pamala Hurry the Glass blower/designer. Pamala Hurry confirmed that they did receive a fax with Mr. Byers wound culture sensitivities on 07/29/17  but the house MD (Dr. Keenan Bachelor) did not change his antibiotics. Dr. Isidore Moos e-prescribed a new antibiotic to the requested pharmacy. I also faxed an order from Dr. Isidore Moos to dc his current antibiotic, cipro and a copy of the newly prescribed. I have asked Gayleen Orem RN (head and neck navigator) to notify his facility about his upcoming appointment with his ENT physician at [facility].

## 2017-08-06 ENCOUNTER — Telehealth: Payer: Self-pay | Admitting: *Deleted

## 2017-08-06 NOTE — Telephone Encounter (Signed)
Oncology Nurse Navigator Documentation  In follow-up to my inquiry this morning, received call from ENT Dr. Trish Mage MA Verdis Frederickson indicating pt has appt 11/16 1420 to evaluate tumor infection response to abx per 11/6 request.  Dr. Isidore Moos informed.  Gayleen Orem, RN, BSN, [place] Oncology Nurse [place] at [place] [phone]

## 2017-08-22 ENCOUNTER — Other Ambulatory Visit (HOSPITAL_COMMUNITY)
Admission: RE | Admit: 2017-08-22 | Discharge: 2017-08-22 | Disposition: A | Discharge status: Home / Self Care | Payer: Medicare Other | Source: Other Acute Inpatient Hospital | Attending: Radiation Oncology | Admitting: Radiation Oncology

## 2017-08-22 ENCOUNTER — Encounter: Payer: Self-pay | Admitting: Radiation Oncology

## 2017-08-22 ENCOUNTER — Ambulatory Visit: Payer: Self-pay | Admitting: Radiation Oncology

## 2017-08-22 ENCOUNTER — Ambulatory Visit
Admission: RE | Admit: 2017-08-22 | Discharge: 2017-08-22 | Disposition: A | Discharge status: Home / Self Care | Payer: Medicare Other | Source: Ambulatory Visit | Attending: Radiation Oncology | Admitting: Radiation Oncology

## 2017-08-22 VITALS — BP 142/59 | HR 61 | Temp 98.1°F

## 2017-08-22 DIAGNOSIS — X58XXXA Exposure to other specified factors, initial encounter: Secondary | ICD-10-CM | POA: Diagnosis not present

## 2017-08-22 DIAGNOSIS — C770 Secondary and unspecified malignant neoplasm of lymph nodes of head, face and neck: Secondary | ICD-10-CM | POA: Diagnosis not present

## 2017-08-22 DIAGNOSIS — S01309A Unspecified open wound of unspecified ear, initial encounter: Secondary | ICD-10-CM | POA: Insufficient documentation

## 2017-08-22 NOTE — Progress Notes (Signed)
Melvin Kent presents for follow up of radiation. This appointment had been moved, but it was not communicated to patient's family correctly. He is doing well. His right behind the ear wound is uncovered. There is green/yellow drainage present. The caregiver that is here also reports that he has drainage from his Right ear.   BP (!) 142/59   Pulse 61   Temp 98.1 F (36.7 C) Comment: axillary  SpO2 100% Comment: room mair

## 2017-08-26 LAB — AEROBIC CULTURE  (SUPERFICIAL SPECIMEN)

## 2017-08-26 LAB — AEROBIC CULTURE W GRAM STAIN (SUPERFICIAL SPECIMEN)

## 2017-08-26 NOTE — Progress Notes (Signed)
Radiation Oncology         (336) [phone] ________________________________  Name: Melvin Kent MRN: [id number]  Date: 08/22/2017  DOB: 09-08-1930  Follow-Up Visit Note  CC: [facility]  Helayne Seminole, MD  Diagnosis and Prior Radiotherapy:       ICD-10-CM   1. 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  CHIEF COMPLAINT:  Here for follow-up and surveillance of head and neck cancer  Narrative:  Patient completed bactrim, wound behind right ear is much better.  Patient/family arrived without realizing that he did not have an appt today. I saw him nonetheless. He did not f/u with ENT.          ALLERGIES:  is allergic to morphine and related and penicillins.  Meds: Current Outpatient Medications  Medication Sig Dispense Refill  . acetaminophen (TYLENOL) 500 MG tablet Take 500 mg by mouth every 6 (six) hours as needed for mild pain or moderate pain.    . carvedilol (COREG) 6.25 MG tablet Take 6.25 mg by mouth 2 (two) times daily.     . diazepam (VALIUM) 5 MG tablet Take 5 mg by mouth 2 (two) times daily. Take 1/2 tablet by mouth twice daily    . donepezil (ARICEPT) 10 MG tablet Take 10 mg by mouth daily.    . Emollient (CERAVE) CREA Apply topically.    Marland Kitchen escitalopram (LEXAPRO) 20 MG tablet Take 20 mg by mouth daily.    . feeding supplement (BOOST HIGH PROTEIN) LIQD Take 1 Container by mouth 3 (three) times daily between meals.    . furosemide (LASIX) 20 MG tablet Take one pill as needed for swelling    . glucose blood ([employer]S BLOOD GLUCOSE TEST STRIPS) test strip  1 each by Other route every morning. Freestyle lite test strips.    . haloperidol (HALDOL) 1 MG tablet Take 1 mg by mouth every 8 (eight) hours as needed for agitation. Take one tablet by mouth 3 times daily as needed for agitation    . hydrALAZINE (APRESOLINE) 25 MG tablet Take 50 mg by mouth 3 (three) times daily. Take 50 mg by mouth three times daily.    . hydrOXYzine (ATARAX/VISTARIL) 10 MG tablet Take 10 mg by mouth every 12 (twelve) hours as needed for anxiety (agitation).    Marland Kitchen ipratropium (ATROVENT) 0.03 % nasal spray     . ketoconazole (NIZORAL) 2 % cream Apply 1 application topically 2 (two) times daily. Apply topically to affected area on genital rash twice daily for 14 days until 06/19/17    . ketotifen (ZADITOR) 0.025 % ophthalmic solution Place 1 drop into both eyes 2 (two) times daily as needed (for itching).    . Lactobacillus Acid-Pectin (ACIDOPHILUS/PECTIN) CAPS Take by mouth.    . lamoTRIgine (LAMICTAL) 100 MG tablet Take 100 mg by mouth daily.    . memantine (NAMENDA) 10 MG tablet Take 10 mg by mouth daily.    . naproxen (NAPROSYN) 500 MG tablet Take 500 mg by mouth 2 (two) times daily with a meal.    .  OLANZapine (ZYPREXA) 5 MG tablet Take 5 mg by mouth daily. Dissolve 1/2 tablet (2.5 mg) on the tongue in the morning, and dissolve 1 tablet on the tongue at supper,    . ondansetron (ZOFRAN) 4 MG tablet Take 4 mg by mouth every 8 (eight) hours as needed for nausea or vomiting.    . polycarbophil (FIBERCON) 625 MG tablet Take 625 mg by mouth daily.    . polyethylene glycol (MIRALAX / GLYCOLAX) packet Take 17 g by mouth daily as needed.    Marland Kitchen PRESCRIPTION MEDICATION Inject 1 each into the skin once a week. Allergy shot. --Mondays--    . ranitidine (ZANTAC) 300 MG tablet Take 300 mg by mouth at bedtime.    . ReliOn Ultra Thin Lancets MISC 1 each every morning. FreeStyle Lancets    . sitaGLIPtin (JANUVIA) 50 MG tablet Take 50 mg by mouth every morning.     . sodium chloride (OCEAN)  0.65 % SOLN nasal spray Place 1-2 sprays into both nostrils daily as needed for congestion.    . sulfamethoxazole-trimethoprim (BACTRIM DS,SEPTRA DS) 800-160 MG tablet Take 1 tablet 2 (two) times daily by mouth. Take for 14 days. 28 tablet 0   No current facility-administered medications for this encounter.     Physical Findings: Wt Readings from Last 3 Encounters:  06/06/17 178 lb 9.6 oz (81 kg)  09/04/15 189 lb (85.7 kg)  04/21/15 198 lb 11.2 oz (90.1 kg)    temperature is 98.1 F (36.7 C). His blood pressure is 142/59 (abnormal) and his pulse is 61. His oxygen saturation is 100%.    HEENT: The tumor behind his right ear has flattened with much less discharge.  No significant  foul odor. Crusting in ear canal on Right. Musculoskeletal: Presents in a wheelchair today. Psych: somnolent, not responding coherently Skin; Significant resolution of erythema of the skin around the tumor.    Lab Findings: Lab Results  Component Value Date   WBC 7.3 07/25/2014   HGB 12.8 (L) 07/25/2014   HCT 39.8 07/25/2014   MCV 87.7 07/25/2014   PLT 206 07/25/2014    Lab Results  Component Value Date   TSH 2.500 08/10/2014    Radiographic Findings: No results found.  Impression/Plan:    Wound at tumor site: Good improvement on Bactrim.  Wound culture repeated today to see if there are some resistant, residual bacteria.  F/u in February as scheduled.   _____________________________________   [name], MD

## 2017-08-27 ENCOUNTER — Other Ambulatory Visit: Payer: Self-pay | Admitting: Radiation Oncology

## 2017-08-27 MED ORDER — CEFUROXIME AXETIL 250 MG PO TABS: 250.0000 mg | ORAL_TABLET | Freq: Two times a day (BID) | ORAL | 0 refills | Status: DC

## 2017-10-09 ENCOUNTER — Telehealth: Payer: Self-pay | Admitting: *Deleted

## 2017-10-09 NOTE — Telephone Encounter (Signed)
CALLED PATIENT TO ALTER FU ON 10-31-17 DUE TO DR. SQUIRE BEING ON VACATION, RESCHEDULED FOR 11-12-17 @ 11:00 AM, PATIENT'S WIFE AGREED TO NEW DATE AND TIME

## 2017-10-31 ENCOUNTER — Ambulatory Visit: Payer: Self-pay | Admitting: Radiation Oncology

## 2017-11-07 NOTE — Progress Notes (Signed)
Error, duplicate

## 2017-11-12 ENCOUNTER — Telehealth: Payer: Self-pay

## 2017-11-12 ENCOUNTER — Inpatient Hospital Stay
Admission: RE | Admit: 2017-11-12 | Discharge: 2017-11-12 | Disposition: A | Discharge status: Home / Self Care | Payer: Self-pay | Source: Ambulatory Visit | Attending: Radiation Oncology | Admitting: Radiation Oncology

## 2017-11-12 ENCOUNTER — Telehealth: Payer: Self-pay | Admitting: *Deleted

## 2017-11-12 NOTE — Telephone Encounter (Signed)
I called and spoke to Ms. [place] regarding Mr. Holsomback's missed appointment today. She apologized and reported that she had missed that he had an appointment today. She would like to reschedule. Our office will call her back with a new appointment date and time. She voiced her understanding and knows to call me if she has any concerns or questions.

## 2017-11-12 NOTE — Telephone Encounter (Signed)
CALLED PATIENT TO RESCHEDULE FU FOR 11-12-17, PATIENT AGREED TO 11-25-17 @ 2:20 PM

## 2017-11-14 ENCOUNTER — Ambulatory Visit: Payer: Self-pay | Admitting: Radiation Oncology

## 2017-11-20 NOTE — Progress Notes (Signed)
Mr. Melvin Kent presents for follow up of radiation completed 07/11/17 to his right aurical area.  Pain issues, if any: He deneis Using a feeding tube?: N/A Weight changes, if any: N/A Swallowing issues, if any: He denies Smoking or chewing tobacco? N/A Using fluoride trays daily?  Last ENT visit was on:  Other notable issues, if any:  He saw Dr. Nevada Crane (dermatologist) recently for evaluation. He felt like there was an infection to the wound behind is right ear. He was started on Clindamycin 11/19/17 and will complete 12/01/17 His wife his also concerned about a hard mass below his right ear lobe. Mr. Melvin Kent denies pain to this area.   BP (!) 115/43   Pulse (!) 43   Temp 98.2 F (36.8 C)   SpO2 99% Comment: room air

## 2017-11-25 ENCOUNTER — Ambulatory Visit
Admission: RE | Admit: 2017-11-25 | Discharge: 2017-11-25 | Disposition: A | Discharge status: Home / Self Care | Payer: Medicare Other | Source: Ambulatory Visit | Attending: Radiation Oncology | Admitting: Radiation Oncology

## 2017-11-25 ENCOUNTER — Encounter: Payer: Self-pay | Admitting: Radiation Oncology

## 2017-11-25 VITALS — BP 115/43 | HR 43 | Temp 98.2°F

## 2017-11-25 DIAGNOSIS — C770 Secondary and unspecified malignant neoplasm of lymph nodes of head, face and neck: Secondary | ICD-10-CM | POA: Insufficient documentation

## 2017-11-25 DIAGNOSIS — Z08 Encounter for follow-up examination after completed treatment for malignant neoplasm: Secondary | ICD-10-CM | POA: Insufficient documentation

## 2017-11-25 DIAGNOSIS — Z923 Personal history of irradiation: Secondary | ICD-10-CM | POA: Insufficient documentation

## 2017-11-25 DIAGNOSIS — Z885 Allergy status to narcotic agent status: Secondary | ICD-10-CM | POA: Insufficient documentation

## 2017-11-25 DIAGNOSIS — Z79899 Other long term (current) drug therapy: Secondary | ICD-10-CM | POA: Insufficient documentation

## 2017-11-25 DIAGNOSIS — Z880 Allergy status to penicillin: Secondary | ICD-10-CM | POA: Insufficient documentation

## 2017-11-25 NOTE — Progress Notes (Addendum)
Radiation Oncology         [phone]) [phone] ________________________________  Name: Melvin Kent MRN: [id number]  Date: 11/25/2017  DOB: Jun 25, 1930  Follow-Up Visit Note  CC: [facility]  Helayne Seminole, MD  Diagnosis and Prior Radiotherapy:       ICD-10-CM   1. 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  CHIEF COMPLAINT:  Here for follow-up and surveillance of head and neck cancer  Narrative:  The patient returns today for follow-up of radiation completed 5 months ago  Pain issues, if any: He denies.  Swallowing issues, if any: He denies.  Other notable issues, if any: He saw Dr. Nevada Crane (dermatologist) recently for evaluation. He felt like there was an infection to the wound behind is right ear. He was started on clindamycin 11/19/2017 and will complete 12/01/2017. His wife his also concerned about a hard mass below his right ear lobe. Mr. Knotts denies pain to this area. He is seeing wound care regularly.      ALLERGIES:  is allergic to morphine and related and penicillins.  Meds: Current Outpatient Medications  Medication Sig Dispense Refill  . acetaminophen (TYLENOL) 500 MG tablet Take 500 mg by mouth every 6 (six) hours as needed for mild pain or moderate pain.    . clindamycin (CLEOCIN) 300 MG capsule Take 300 mg by mouth 2 (two) times daily. Start 11/19/17 finish 12/01/17    . diazepam (VALIUM) 5 MG tablet Take 5 mg by mouth 2 (two) times daily. Take 1/2 tablet by mouth twice daily    . Emollient (CERAVE) CREA Apply topically.    Marland Kitchen escitalopram (LEXAPRO)  20 MG tablet Take 20 mg by mouth daily.    . feeding supplement (BOOST HIGH PROTEIN) LIQD Take 1 Container by mouth 3 (three) times daily between meals.    . furosemide (LASIX) 20 MG tablet Take one pill as needed for swelling    . glucose blood ([employer] BLOOD GLUCOSE TEST STRIPS) test strip 1 each by Other route every morning. Freestyle lite test strips.    . haloperidol (HALDOL) 1 MG tablet Take 1 mg by mouth every 8 (eight) hours as needed for agitation. Take one tablet by mouth 3 times daily as needed for agitation    . hydrALAZINE (APRESOLINE) 25 MG tablet Take 50 mg by mouth 3 (three) times daily. Take 50 mg by mouth three times daily.    . hydrOXYzine (ATARAX/VISTARIL) 10 MG tablet Take 10 mg by mouth every 12 (twelve) hours as needed for anxiety (agitation).    Marland Kitchen ipratropium (ATROVENT) 0.03 % nasal spray     . ketoconazole (NIZORAL) 2 % cream Apply 1 application topically 2 (two) times daily. Apply topically to affected area on genital rash twice daily for 14 days until 06/19/17    . ketotifen (ZADITOR) 0.025 % ophthalmic solution Place 1 drop into both eyes 2 (two) times daily as needed (for itching).    . Lactobacillus Acid-Pectin (ACIDOPHILUS/PECTIN) CAPS Take by mouth.    . lamoTRIgine (LAMICTAL) 100 MG tablet Take 100 mg  by mouth daily.    . memantine (NAMENDA) 10 MG tablet Take 10 mg by mouth daily.    . naproxen (NAPROSYN) 500 MG tablet Take 500 mg by mouth 2 (two) times daily with a meal.    . OLANZapine (ZYPREXA) 5 MG tablet Take 5 mg by mouth daily. Dissolve 1/2 tablet (2.5 mg) on the tongue in the morning, and dissolve 1 tablet on the tongue at supper,    . ondansetron (ZOFRAN) 4 MG tablet Take 4 mg by mouth every 8 (eight) hours as needed for nausea or vomiting.    . polycarbophil (FIBERCON) 625 MG tablet Take 625 mg by mouth daily.    . polyethylene glycol (MIRALAX / GLYCOLAX) packet Take 17 g by mouth daily as needed.    Marland Kitchen PRESCRIPTION MEDICATION Inject 1 each into the skin once a  week. Allergy shot. --Mondays--    . ranitidine (ZANTAC) 300 MG tablet Take 300 mg by mouth at bedtime.    . ReliOn Ultra Thin Lancets MISC 1 each every morning. FreeStyle Lancets    . sitaGLIPtin (JANUVIA) 50 MG tablet Take 50 mg by mouth every morning.     . sodium chloride (OCEAN) 0.65 % SOLN nasal spray Place 1-2 sprays into both nostrils daily as needed for congestion.    . carvedilol (COREG) 6.25 MG tablet Take 6.25 mg by mouth 2 (two) times daily.     Marland Kitchen donepezil (ARICEPT) 10 MG tablet Take 10 mg by mouth daily.     No current facility-administered medications for this encounter.     Physical Findings: Wt Readings from Last 3 Encounters:  06/06/17 178 lb 9.6 oz (81 kg)  09/04/15 189 lb (85.7 kg)  04/21/15 198 lb 11.2 oz (90.1 kg)    temperature is 98.2 F (36.8 C). His blood pressure is 115/43 (abnormal) and his pulse is 43 (abnormal). His oxygen saturation is 99%.    General: Alert - more alert than recent visits, in no acute distress. HEENT: He has a concavity behind his right ear that is draining fluid. It's about 2 cm in greatest dimension. Mucous membranes are somewhat dry in his mouth. No lesions appreciated on his tongue or roof of mouth. Neck: He has swelling in the angle of his right mandible/parotid region which could be reactive or could more likely be an area of cancer spread such as new peri-parotid lymph node. No other obvious masses are appreciated throughout his neck bilaterally. Lymphatics: see Neck Exam Musculoskeletal: Presents in wheelchair today.    Lab Findings: Lab Results  Component Value Date   WBC 7.3 07/25/2014   HGB 12.8 (L) 07/25/2014   HCT 39.8 07/25/2014   MCV 87.7 07/25/2014   PLT 206 07/25/2014    Lab Results  Component Value Date   TSH 2.500 08/10/2014    Radiographic Findings: No results found.  Impression/Plan:  He has swelling in the angle of his right mandible/parotid region which could be reactive inflammation or could more  likely be an area of cancer spread.  I discussed options with the patient's wife and caregivers in detail, including hospice (for which he qualifies given life expectancy <55mo), CT imaging of neck, and CT imaging of neck and chest.  Per their wishes, I will order CT of the neck and chest to restage his cancer, and he will follow up with Dr. Blenda Nicely first and with me soon thereafter. If she feels there is no need for further antibiotics or biopsy or surgery, he will instead  follow up with me to consider radiation therapy to this region vs comfort measures only.  He also has a concavity behind his right ear at the prior tumor site that is draining fluid. He is on clindamycin for this. I recommended to the patient's wife and caregivers that they ask the wound care providers about applying gentian violet for further healing, if appropriate.   I spent over 15 minutes with the patient, face to face, and over 50% of that time on counseling and coordination of care. _____________________________________   [name], MD   This document serves as a record of services personally performed by [name], MD. It was created on her behalf by Rae Lips, a trained medical scribe. The creation of this record is based on the scribe's personal observations and the provider's statements to them. This document has been checked and approved by the attending provider.

## 2017-11-27 ENCOUNTER — Encounter: Payer: Self-pay | Admitting: Radiation Oncology

## 2017-11-27 ENCOUNTER — Other Ambulatory Visit: Payer: Self-pay | Admitting: Radiation Oncology

## 2017-11-27 DIAGNOSIS — C770 Secondary and unspecified malignant neoplasm of lymph nodes of head, face and neck: Secondary | ICD-10-CM

## 2017-12-02 ENCOUNTER — Telehealth: Payer: Self-pay | Admitting: *Deleted

## 2017-12-02 NOTE — Telephone Encounter (Signed)
CALLED PATIENT TO INFORM OF STAT LABS ON 12-04-17 @ 1:45 PM @ [place] AND HIS CT ON 12-04-17 - @ ARRIVAL TIME- 2:45 PM @ [facility], PT. TO HAVE CLEAR LIQUIDS ONLY - 4 HRS. PRIOR TO TEST,TEST TO BE @ [facility], LVM FOR A RETURN CALL

## 2017-12-04 ENCOUNTER — Ambulatory Visit
Admission: RE | Admit: 2017-12-04 | Discharge: 2017-12-04 | Disposition: A | Discharge status: Home / Self Care | Payer: Medicare Other | Source: Ambulatory Visit | Attending: Radiation Oncology | Admitting: Radiation Oncology

## 2017-12-04 ENCOUNTER — Ambulatory Visit (HOSPITAL_COMMUNITY)
Admission: RE | Admit: 2017-12-04 | Discharge: 2017-12-04 | Disposition: A | Discharge status: Home / Self Care | Payer: Medicare Other | Source: Ambulatory Visit | Attending: Radiation Oncology | Admitting: Radiation Oncology

## 2017-12-04 DIAGNOSIS — L98499 Non-pressure chronic ulcer of skin of other sites with unspecified severity: Secondary | ICD-10-CM | POA: Diagnosis not present

## 2017-12-04 DIAGNOSIS — C770 Secondary and unspecified malignant neoplasm of lymph nodes of head, face and neck: Secondary | ICD-10-CM | POA: Insufficient documentation

## 2017-12-04 DIAGNOSIS — R590 Localized enlarged lymph nodes: Secondary | ICD-10-CM | POA: Insufficient documentation

## 2017-12-04 DIAGNOSIS — C801 Malignant (primary) neoplasm, unspecified: Secondary | ICD-10-CM | POA: Insufficient documentation

## 2017-12-04 LAB — BUN & CREATININE (CHCC)
BUN: 25 mg/dL (ref 7–26)
CREATININE: 1.51 mg/dL — AB (ref 0.70–1.30)
GFR, Est AFR Am: 47 mL/min — ABNORMAL LOW (ref >60)
GFR, Estimated: 40 mL/min — ABNORMAL LOW (ref >60)

## 2017-12-04 MED ORDER — IOPAMIDOL (ISOVUE-300) INJECTION 61%
INTRAVENOUS | Status: AC
  Administered 2017-12-04: 75 mL via INTRAVENOUS
  Filled 2017-12-04: qty 75

## 2017-12-04 MED ORDER — IOPAMIDOL (ISOVUE-300) INJECTION 61%
INTRAVENOUS | Status: AC
  Filled 2017-12-04: qty 100

## 2017-12-05 ENCOUNTER — Telehealth: Payer: Self-pay | Admitting: *Deleted

## 2017-12-05 NOTE — Telephone Encounter (Signed)
XXXX 

## 2017-12-05 NOTE — Telephone Encounter (Signed)
CALLED PATIENT TO INFORM OF APPT. WITH DR. [place] ON 12-10-17 - ARRIVAL TIME - 4 PM OF [facility] AND HIS FU WITH DR. Isidore Moos ON 12-12-17 @ 11:40 AM, LVM FOR A RETURN CALL

## 2017-12-09 ENCOUNTER — Telehealth: Payer: Self-pay | Admitting: *Deleted

## 2017-12-09 NOTE — Telephone Encounter (Signed)
CALLED PATIENT TO INFORM THAT APPT. WITH DR. MARCELLINO FOR 12-10-17 @ 4 PM HAS BEEN CANCELLED PER DR. SQUIRE REQUEST, LVM FOR A RETURN CALL

## 2017-12-09 NOTE — Telephone Encounter (Signed)
Oncology Nurse Navigator Documentation  Spoke with patient's wife, informed her of 3/22 10:20 appt with Dr. Isidore Moos followed by 11:00 CT SIM.  She stated uncertainty re additional RT but agreed important to discuss with Dr. Isidore Moos and will keep appt.   Gayleen Orem, RN, BSN Head & Neck Oncology Nurse [place] at [place] [phone]

## 2017-12-10 ENCOUNTER — Telehealth: Payer: Self-pay | Admitting: *Deleted

## 2017-12-10 NOTE — Telephone Encounter (Addendum)
Oncology Nurse Navigator Documentation  Rec'd call from pt's dtr, provided clarification of Friday's appts.  She voiced understanding.  Gayleen Orem, RN, BSN Head & Neck Oncology Nurse [place] at [place] [phone]

## 2017-12-10 NOTE — Progress Notes (Signed)
Melvin Kent presents for follow up of radiation completed 07/11/17 to his postauricular region.   Pain issues, if any: He will occasionally report pain to his Right ear area Using a feeding tube?: No Weight changes, if any: unable to weigh.  Swallowing issues, if any: They report that he is swallowing well.  Smoking or chewing tobacco? No Using fluoride trays daily? N/A Last ENT visit was on: N/A Other notable issues, if any:  12/04/17 CT Neck IMPRESSION: 1. Progression of disease. New 4 cm necrotic adenopathy in the right parotid tail which invades the sternocleidomastoid and contacts the right mandibular ramus. New cavitary adenopathy in the left level 3 neck. 2. Nonspecific ulcer with enhancement at the site of previously seen right retroauricular mass. 3. Chest CT reported separately. 4. Motion degraded  BP (!) 121/37   Pulse (!) 49   Temp 98 F (36.7 C)   SpO2 98% Comment: room air

## 2017-12-12 ENCOUNTER — Ambulatory Visit
Admission: RE | Admit: 2017-12-12 | Discharge: 2017-12-12 | Disposition: A | Discharge status: Home / Self Care | Payer: Medicare Other | Source: Ambulatory Visit | Attending: Radiation Oncology | Admitting: Radiation Oncology

## 2017-12-12 ENCOUNTER — Encounter: Payer: Self-pay | Admitting: *Deleted

## 2017-12-12 ENCOUNTER — Other Ambulatory Visit: Payer: Self-pay

## 2017-12-12 VITALS — BP 121/37 | HR 49 | Temp 98.0°F

## 2017-12-12 DIAGNOSIS — C770 Secondary and unspecified malignant neoplasm of lymph nodes of head, face and neck: Secondary | ICD-10-CM | POA: Insufficient documentation

## 2017-12-12 DIAGNOSIS — L98499 Non-pressure chronic ulcer of skin of other sites with unspecified severity: Secondary | ICD-10-CM | POA: Diagnosis not present

## 2017-12-12 DIAGNOSIS — R590 Localized enlarged lymph nodes: Secondary | ICD-10-CM | POA: Insufficient documentation

## 2017-12-12 DIAGNOSIS — H9201 Otalgia, right ear: Secondary | ICD-10-CM | POA: Insufficient documentation

## 2017-12-12 NOTE — Progress Notes (Signed)
Radiation Oncology         [phone] ________________________________  Name: Melvin Kent MRN: [id number]  Date: 12/12/2017  DOB: [birth year]  Follow-Up Visit Note  CC: [facility]  Helayne Seminole, MD  Diagnosis and Prior Radiotherapy:       ICD-10-CM   1. 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  CHIEF COMPLAINT:  Here for follow-up and surveillance of head and neck cancer and to discuss recent imaging results  Narrative:  The patient returns today for follow-up of radiation completed 5 months ago to the right postauricular region. His CT of the neck on 12/04/2017 showed progression of disease. There is new 4 cm necrotic adenopathy in the right parotid tail which invades the sternocleidomastoid and contacts the right mandibular ramus. There is new cavitary adenopathy in the left level 3 neck. There is also a nonspecific ulcer with enhancement at the site of previously seen right retroauricular mass. His CT of the chest from 12/04/2017 showed no evidence of malignancy in the thorax.   Pain issues, if any: He will occasionally report pain to his right ear area.  Using a feeding tube?: No  Weight changes, if any: Unable to weigh.   Swallowing issues, if any: His family reports that he is swallowing well.   Smoking or chewing tobacco? No      ALLERGIES:  is allergic to morphine and related and penicillins.  Meds: Current Outpatient Medications  Medication Sig Dispense Refill  . acetaminophen (TYLENOL) 500 MG tablet Take 500 mg by mouth every 6 (six)  hours as needed for mild pain or moderate pain.    . carvedilol (COREG) 6.25 MG tablet Take 6.25 mg by mouth 2 (two) times daily.     . clindamycin (CLEOCIN) 300 MG capsule Take 300 mg by mouth 2 (two) times daily. Start 11/19/17 finish 12/01/17    . diazepam (VALIUM) 5 MG tablet Take 5 mg by mouth 2 (two) times daily. Take 1/2 tablet by mouth twice daily    . donepezil (ARICEPT) 10 MG tablet Take 10 mg by mouth daily.    . Emollient (CERAVE) CREA Apply topically.    Marland Kitchen escitalopram (LEXAPRO) 20 MG tablet Take 20 mg by mouth daily.    . feeding supplement (BOOST HIGH PROTEIN) LIQD Take 1 Container by mouth 3 (three) times daily between meals.    . furosemide (LASIX) 20 MG tablet Take one pill as needed for swelling    . glucose blood ([employer]S BLOOD GLUCOSE TEST STRIPS) test strip 1 each by Other route every morning. Freestyle lite test strips.    . haloperidol (HALDOL) 1 MG tablet Take 1 mg by mouth every 8 (eight) hours as needed for agitation. Take one tablet by mouth 3 times daily as needed for agitation    . hydrALAZINE (APRESOLINE) 25 MG tablet Take 50 mg by mouth 3 (three) times daily. Take 50 mg by mouth three times daily.    . hydrOXYzine (ATARAX/VISTARIL) 10 MG tablet Take 10 mg by mouth every 12 (twelve) hours as needed  for anxiety (agitation).    Marland Kitchen ipratropium (ATROVENT) 0.03 % nasal spray     . ketoconazole (NIZORAL) 2 % cream Apply 1 application topically 2 (two) times daily. Apply topically to affected area on genital rash twice daily for 14 days until 06/19/17    . ketotifen (ZADITOR) 0.025 % ophthalmic solution Place 1 drop into both eyes 2 (two) times daily as needed (for itching).    . Lactobacillus Acid-Pectin (ACIDOPHILUS/PECTIN) CAPS Take by mouth.    . lamoTRIgine (LAMICTAL) 100 MG tablet Take 100 mg by mouth daily.    . memantine (NAMENDA) 10 MG tablet Take 10 mg by mouth daily.    . naproxen (NAPROSYN) 500 MG tablet Take 500 mg by mouth 2 (two) times daily with a meal.    .  OLANZapine (ZYPREXA) 5 MG tablet Take 5 mg by mouth daily. Dissolve 1/2 tablet (2.5 mg) on the tongue in the morning, and dissolve 1 tablet on the tongue at supper,    . ondansetron (ZOFRAN) 4 MG tablet Take 4 mg by mouth every 8 (eight) hours as needed for nausea or vomiting.    . polycarbophil (FIBERCON) 625 MG tablet Take 625 mg by mouth daily.    . polyethylene glycol (MIRALAX / GLYCOLAX) packet Take 17 g by mouth daily as needed.    Marland Kitchen PRESCRIPTION MEDICATION Inject 1 each into the skin once a week. Allergy shot. --Mondays--    . ranitidine (ZANTAC) 300 MG tablet Take 300 mg by mouth at bedtime.    . ReliOn Ultra Thin Lancets MISC 1 each every morning. FreeStyle Lancets    . sitaGLIPtin (JANUVIA) 50 MG tablet Take 50 mg by mouth every morning.     . sodium chloride (OCEAN) 0.65 % SOLN nasal spray Place 1-2 sprays into both nostrils daily as needed for congestion.     No current facility-administered medications for this encounter.     Physical Findings: Wt Readings from Last 3 Encounters:  06/06/17 178 lb 9.6 oz (81 kg)  09/04/15 189 lb (85.7 kg)  04/21/15 198 lb 11.2 oz (90.1 kg)    temperature is 98 F (36.7 C). His blood pressure is 121/37 (abnormal) and his pulse is 49 (abnormal). His oxygen saturation is 98%.    General: Alert - more alert than recent visits, in no acute distress. HEENT: He has a concave area of persistent drainage behind the right ear but this has reduced in size over time.  Oropharynx and oral cavity are clear. Neck: He has hardened swelling over the right parotid that is erythematous. The area of swelling measures about 7 cm. No other palpable masses Musculoskeletal: Presents in wheelchair today. Skin: suspicious erythematous crusted lesions over left face, scar healed over midline of scalp from prior cancer.  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 EXAM: CT NECK WITH CONTRAST TECHNIQUE: Multidetector CT imaging of the neck was performed using the standard protocol following the bolus administration of intravenous contrast. CONTRAST:  Seventy-five ISOVUE-300 IOPAMIDOL (ISOVUE-300) INJECTION 61% COMPARISON:  06/03/2017 FINDINGS: Pharynx and larynx: No evidence of mass. Salivary  glands: There is a new necrotic mass in the right tail the parotid, likely adenopathy. The mass involves the parotid tail and anterior belly of the sternocleidomastoid, 3.8 cm on axial slices. There is contact with the right mandibular ramus without visible erosion-although degraded by motion. The other salivary glands are negative. Thyroid: Negative Lymph nodes: 3.8 cm adenopathy at the right parotid tail. New left level 3 cavitary adenopathy measuring 17 mm. Rounded right posterior triangle lymph nodes are size stable, the largest measuring 8 mm. Vascular: Extensive atherosclerotic calcification. No acute finding. Limited intracranial: Negative Visualized orbits: Bilateral cataract resection. Mastoids and visualized paranasal sinuses: Chronic left maxillary sinusitis with complete opacification and sinus atelectasis Skeleton: No visible bony erosion. Severe cervical facet arthropathy. Diffuse spondylosis and disc degeneration with C5-6 and C6-7 ACDF. Biforaminal impingement that is severe at C2-3 and C3-4. Upper chest: Reported separately Other: The patient's retromastoid mass has been resected and there is nonspecific enhancement and soft tissue thickening in the defect. IMPRESSION: 1. Progression of disease. New 4 cm necrotic adenopathy in the right parotid tail which invades the sternocleidomastoid and contacts the right mandibular  ramus. New cavitary adenopathy in the left level 3 neck. 2. Nonspecific ulcer with enhancement at the site of previously seen right retroauricular mass. 3. Chest CT reported separately. 4. Motion degraded. Electronically Signed   By: Monte Fantasia M.D.   On: 12/04/2017 16:42   Ct Chest W Contrast  Result Date: 12/05/2017 CLINICAL DATA:  Head neck carcinoma.  Post radiation treatment. EXAM: CT CHEST WITH CONTRAST TECHNIQUE: Multidetector CT imaging of the chest was performed during intravenous contrast administration. CONTRAST:  75 cc Isovue COMPARISON:  Chest CT 06/21/2008 FINDINGS: Cardiovascular: Coronary artery calcification and aortic atherosclerotic calcification. Mediastinum/Nodes: No axillary supraclavicular adenopathy. No mediastinal. There is large calcified subcarinal lymph node measuring 2.5 cm which is not changed. Lungs/Pleura: No suspicious pulmonary nodules. Upper Abdomen: Limited view of the liver, kidneys, pancreas are unremarkable. Normal adrenal glands. Calcified granulomata within the liver and spleen. Musculoskeletal: No aggressive osseous lesion. Degenerative osteophytosis of the spine. IMPRESSION: 1. No evidence of malignancy in the thorax. 2. Evidence of granulomatous disease with calcified subcarinal lymph node and granulomata within the spleen and liver. See separate report for CT neck. Aortic Atherosclerosis (ICD10-I70.0). Electronically Signed   By: Suzy Bouchard M.D.   On: 12/05/2017 10:25    Impression/Plan:  Excellent local response to prior treatment, but now with regional progression. Recent CT of the neck shows new 4 cm necrotic adenopathy in the right parotid tail and new cavitary adenopathy in the left level 3 neck which are both suspicious for cancer progression. I discussed options with the patient's wife and caregivers in detail, including hospice (for which he qualifies given life expectancy <24mo) and radiotherapy over various fractionations. I believe it is  appropriate to treat it with a short course of radiotherapy to palliate/control his disease. The patient and his family are agreeable with 4 fractions given twice per day over 2 days  ([place]) to the right parotid area and left level 3 neck lymph node as well as any other suspicious adjacent nodes that may be visible at simulation. We discussed the risks, benefits, and side effects of radiotherapy. Side effects may include: thick saliva and dry mouth and taste changes with possible irreversible tissue damage in the setting of reirradiation.  No guarantees of treatment were given. A consent form was signed and placed in the patient's medical record. He is scheduled for CT simulation/planning  today with treatment to take place approximatley April 2nd and 3rd.   I advised the patient and his family to see his dermatologist, Dr. Nevada Crane, to discuss the lesions over the patient's face and scalp and determine whether these are cancerous and need radiation therapy in case we retreat in the future (this regimen could be repeated in a month or more PRN).   He has a concavity behind his right ear at the prior tumor site that is draining fluid. He completed a course of clindamycin for this on 12/01/2017. I recommended to the patient's wife and caregivers that they ask the wound care providers about applying gentian violet for further healing, if appropriate.     We discussed the role of biopsy at this time, but given the high likelihood of progressive cancer, this was deferred (and not recommended by me).  I spent 40 minutes face to face with the patient and more than 50% of that time was spent in counseling and/or coordination of care. _____________________________________   [name], MD   This document serves as a record of services personally performed by [name], MD. It was created on her behalf by Rae Lips, a trained medical scribe. The creation of this record is based on the scribe's  personal observations and the provider's statements to them. This document has been checked and approved by the attending provider.

## 2017-12-13 NOTE — Progress Notes (Signed)
Oncology Nurse Navigator Documentation  Met with Melvin Kent during Follow Up with Dr. Isidore Moos.  He was accompanied by his wife, dtr, Geanie Logan, Owner/Administrator [facility] where he resides. They voiced understanding of continued role of radiotherapy, plan for 2 days BID tmt. I accompanied pt to CT SIM, he tolerated procedure wo/ difficulty. I showed [facility] 1 tmt area, explained registration, arrival and preparation procedures.  She will be transporting pt for tmts. I encouraged calls prior to 4/2 [place].  Gayleen Orem, RN, BSN Head & Neck Oncology Nurse [place] at [place] [phone]

## 2017-12-15 ENCOUNTER — Encounter: Payer: Self-pay | Admitting: Radiation Oncology

## 2017-12-15 NOTE — Progress Notes (Signed)
Head and Neck Cancer Simulation, IMRT treatment planning, and Special treatment procedure note   Outpatient  Diagnosis:    ICD-10-CM   1. 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 An Aquaplast head and shoulder mask was custom fitted to the patient's anatomy. High-resolution CT axial imaging was obtained of the head and neck with contrast. I verified that the quality of the imaging is good for treatment planning. 1 Medically Necessary Treatment Device was fabricated and supervised by me: Aquaplast mask.   Treatment planning note I plan to treat the patient with IMRT. I plan to treat the patient's right parotid tumor and suspicious b/l cervical neck nodes but no empiric nodal coverage. I plan to treat to a total dose of 14 Gray in 4  Fractions (BID, two days in a row. Dose calculation was ordered from dosimetry.  IMRT planning Note  IMRT is medically necessary and an important modality to deliver adequate dose to the patient's at risk tissues while sparing the patient's normal structures, including the: esophagus, parotid tissue, mandible, brain stem, spinal cord, oral cavity, brachial plexus.  This justifies the use of IMRT in the patient's treatment.   Special Treatment Procedure Note: The patient received prior radiotherapy close to his current fields. There could be some overlap of radiation dose.  Prior regional radiotherapy increases the risk of side effects from treatment. I have considered this in the treatment planning process and have aimed to minimize tissue overlap.  This increases the complexity of this patient's treatment and therefore this constitutes a special treatment procedure.   -----------------------------------  [name], MD

## 2017-12-15 NOTE — Addendum Note (Signed)
Encounter addended by: Ajani Schnieders, Stephani Police, RN on: 12/15/2017 9:23 AM  Actions taken: Order Reconciliation Section accessed, Medication taking status modified, Home Medications modified

## 2017-12-15 NOTE — Addendum Note (Signed)
Encounter addended by: Braley Luckenbaugh, Stephani Police, RN on: 12/15/2017 10:01 AM  Actions taken: Order list changed, Home Medications modified, Medication taking status modified, Reconcile Outside Information medication data discarded, Order Reconciliation Section accessed

## 2017-12-16 DIAGNOSIS — C770 Secondary and unspecified malignant neoplasm of lymph nodes of head, face and neck: Secondary | ICD-10-CM | POA: Diagnosis not present

## 2017-12-23 ENCOUNTER — Ambulatory Visit
Admission: RE | Admit: 2017-12-23 | Discharge: 2017-12-23 | Disposition: A | Discharge status: Home / Self Care | Payer: Medicare Other | Source: Ambulatory Visit | Attending: Radiation Oncology | Admitting: Radiation Oncology

## 2017-12-23 DIAGNOSIS — C770 Secondary and unspecified malignant neoplasm of lymph nodes of head, face and neck: Secondary | ICD-10-CM | POA: Insufficient documentation

## 2017-12-24 ENCOUNTER — Encounter: Payer: Self-pay | Admitting: Radiation Oncology

## 2017-12-24 ENCOUNTER — Ambulatory Visit
Admission: RE | Admit: 2017-12-24 | Discharge: 2017-12-24 | Disposition: A | Discharge status: Home / Self Care | Payer: Medicare Other | Source: Ambulatory Visit | Attending: Radiation Oncology | Admitting: Radiation Oncology

## 2017-12-24 DIAGNOSIS — C770 Secondary and unspecified malignant neoplasm of lymph nodes of head, face and neck: Secondary | ICD-10-CM | POA: Diagnosis not present

## 2017-12-24 NOTE — Progress Notes (Signed)
Tumor photo after last fraction of palliative RT today.  Will f/u in 66mo:

## 2017-12-25 ENCOUNTER — Ambulatory Visit: Payer: Medicare Other

## 2017-12-26 ENCOUNTER — Ambulatory Visit: Payer: Medicare Other

## 2017-12-26 ENCOUNTER — Encounter: Payer: Self-pay | Admitting: Radiation Oncology

## 2017-12-26 IMAGING — CT CT NECK W/ CM
4 series · 15 of 33 positions shown, 18 images · IV contrast (APPLIED)
Comparison: None.

CLINICAL DATA: Neck mass. History of prostate cancer and skin
cancers of the face. History of colon cancer [id number]

EXAM:
CT NECK WITH CONTRAST
TECHNIQUE: Multidetector CT imaging of the neck was performed using the
standard protocol following the bolus administration of intravenous
contrast.
CONTRAST:  75mL PIQXFG-4RR IOPAMIDOL (PIQXFG-4RR) INJECTION 61%

[Series 3: neck 2.0 i31s 3 · axial · 0.54mm/px · z∈[-344,-180]mm · 5 of 124 slices shown, 7 images]
[im 21/124  soft-tissue]
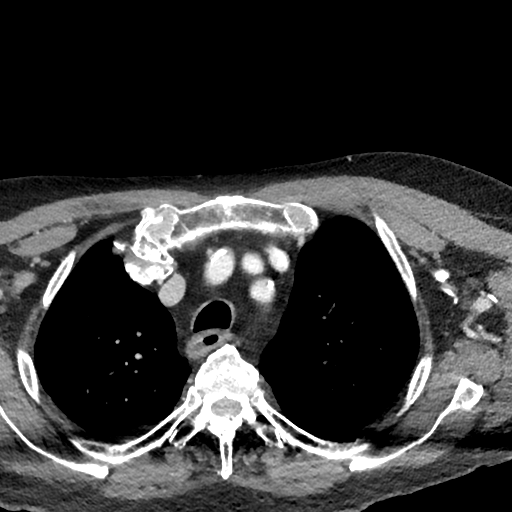
[im 21/124  bone]
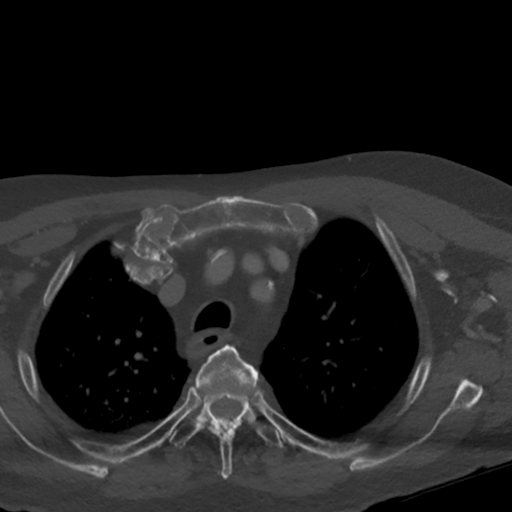
[im 42/124  bone]
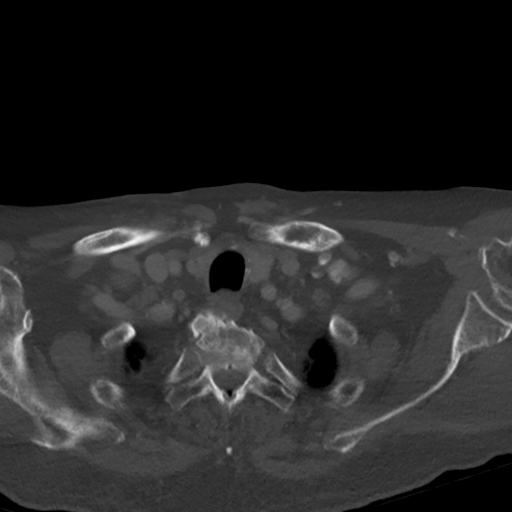
[im 62/124  bone]
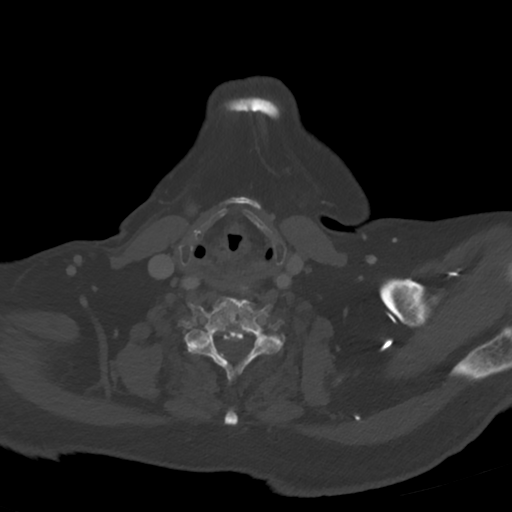
[im 83/124  bone]
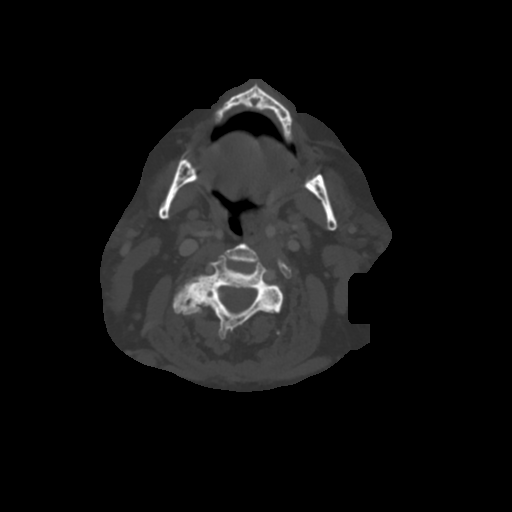
[im 103/124  soft-tissue]
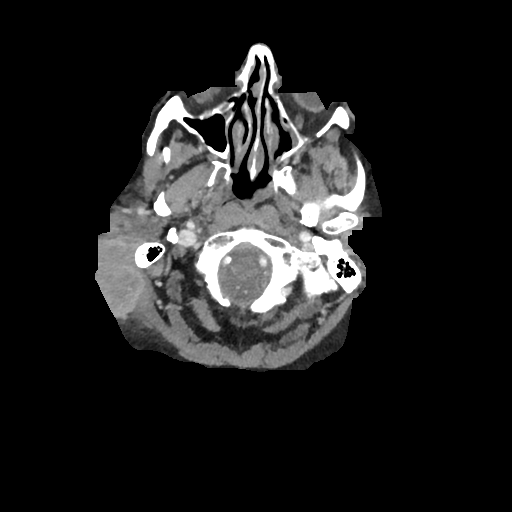
[im 103/124  bone]
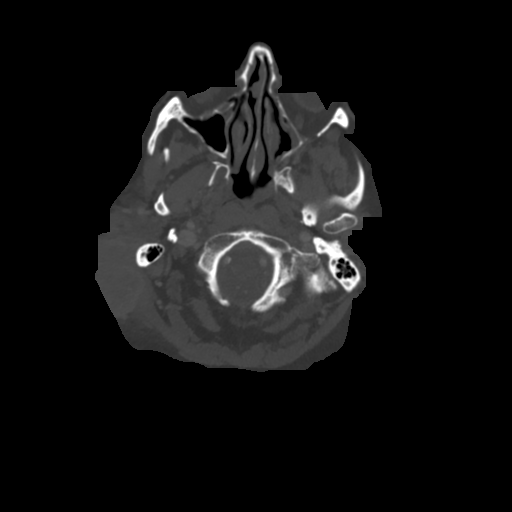

[Series 6: coronal st · coronal · 0.50mm/px · 3 of 133 slices shown]
[im 27/133  bone]
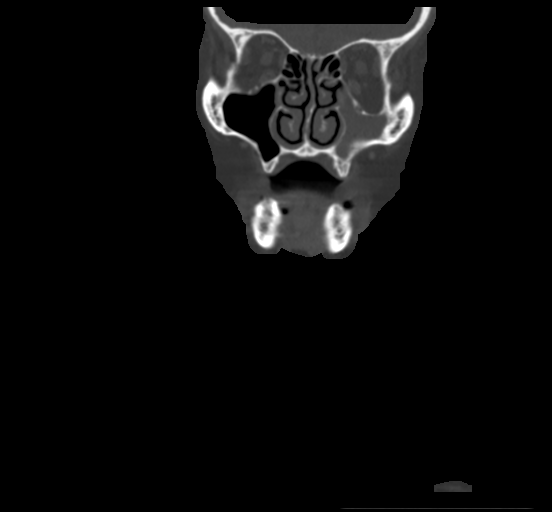
[im 53/133  bone]
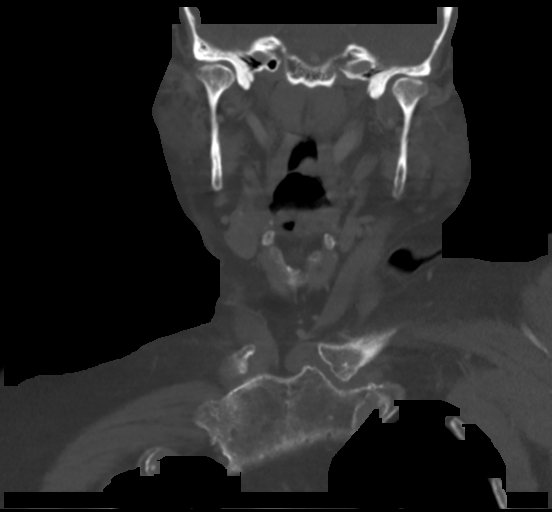
[im 80/133  bone]
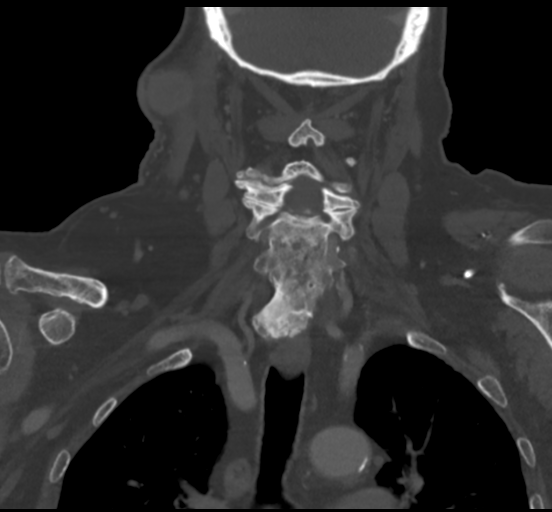

[Series 7: sagittal st · sagittal · 0.50mm/px · 5 of 101 slices shown, 6 images]
[im 34/101  bone]
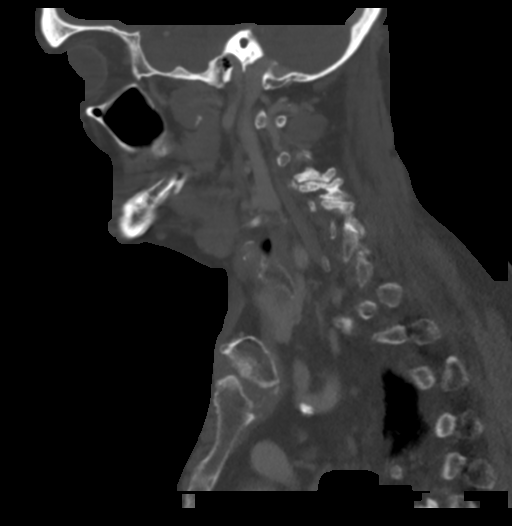
[im 42/101  bone]
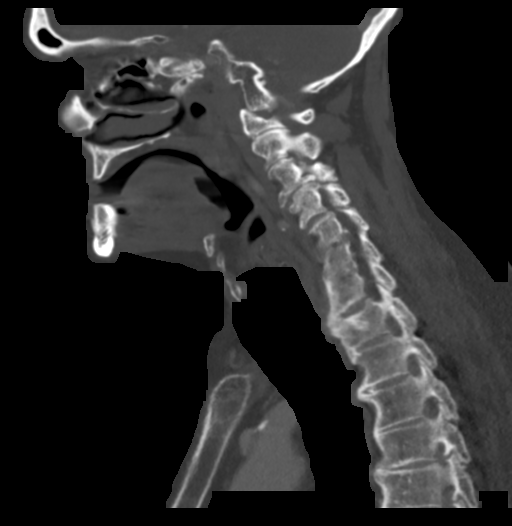
[im 51/101  soft-tissue]
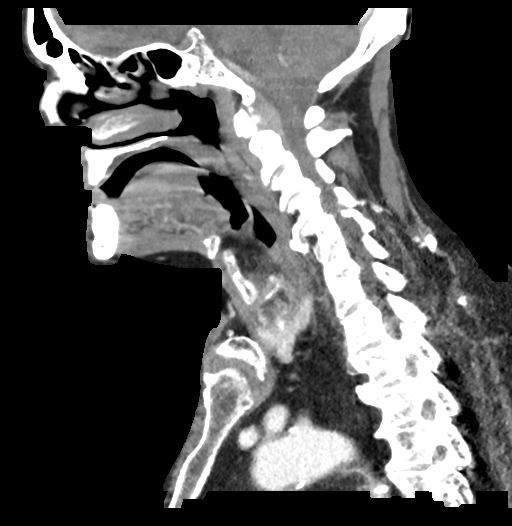
[im 51/101  bone]
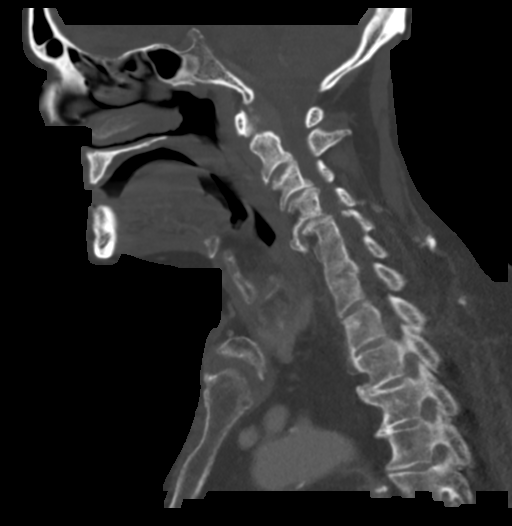
[im 59/101  bone]
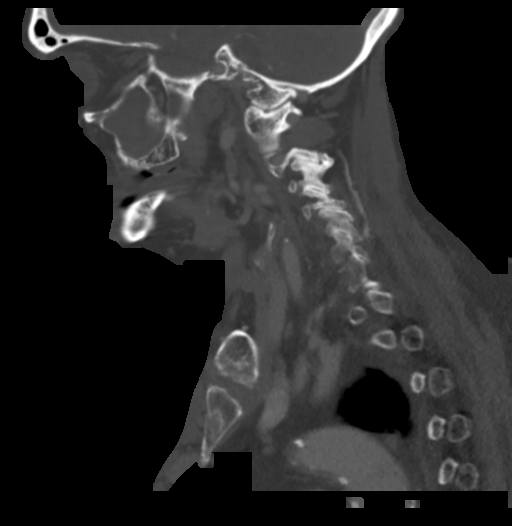
[im 67/101  bone]
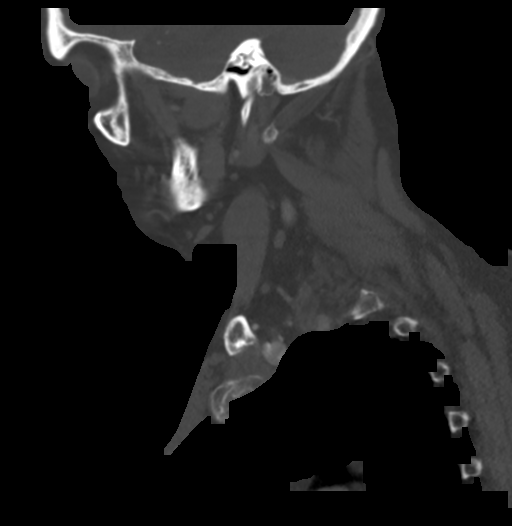

[Series 8: orthogonal st · axial · 0.39mm/px · z∈[-347,-305]mm · 2 of 126 slices shown]
[im 21/126  bone]
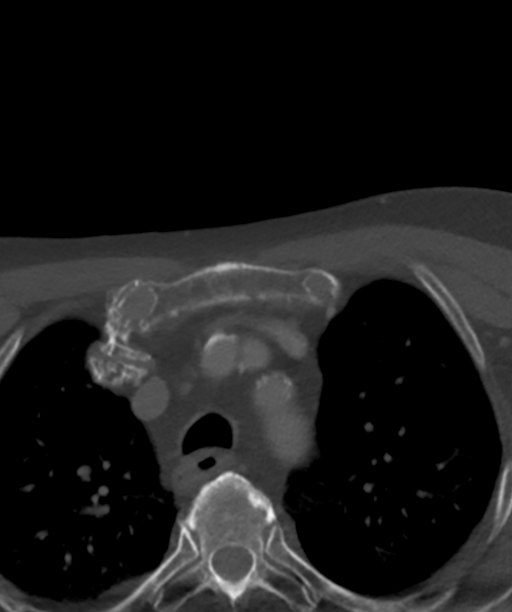
[im 42/126  bone]
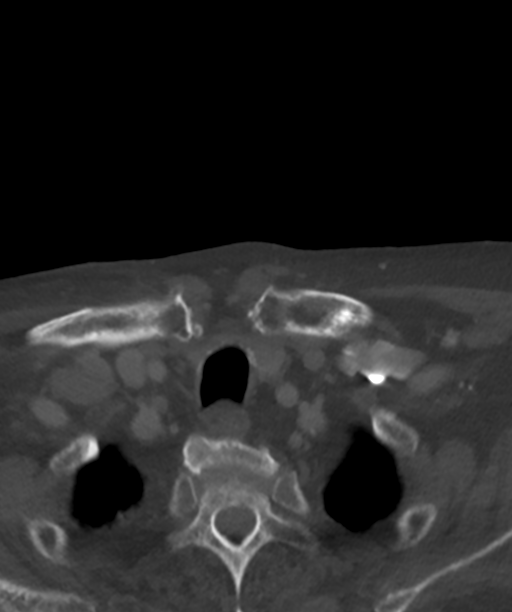

[15 of 33 positions shown; findings below may reference images not displayed]

FINDINGS: Pharynx and larynx: Normal. No mass or swelling.

Salivary glands: Negative for mass or stone. Large soft tissue mass
posterior to the right parotid gland appears extra parotid in
location but is touching the tail of the parotid.

Thyroid: Negative

Lymph nodes: Large enhancing soft tissue mass behind the right ear
measuring 43 x 34 mm. This extends to the mastoid tip. No bony
erosion. No invasion of the external auditory canal. Probable
enlarged malignant lymph node. No other malignant adenopathy in the
neck

Vascular: Carotid artery atherosclerotic disease bilaterally.

Limited intracranial: Negative

Visualized orbits: Bilateral lens replacement.

Mastoids and visualized paranasal sinuses: Mucosal edema in the
paranasal sinuses. Contraction and opacification left maxillary
sinus

Skeleton: Cervical spine degenerative change. Cervical fusion C5-6
and C6-7. No acute skeletal abnormality.

Upper chest: Lung apices clear.

Other: None
IMPRESSION: Large enhancing soft tissue mass behind the right ear most likely
malignant adenopathy from metastatic disease. Possibly related to
skin cancer. No pharyngeal mass identified. Direct mucosal
evaluation recommended to exclude pharyngeal mucosal lesion.

## 2017-12-26 NOTE — Progress Notes (Signed)
  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  Plan: The patient has completed radiation treatment. The patient will return to radiation oncology clinic for routine followup in one month. I advised them to call or return sooner if they have any questions or concerns related to their recovery or treatment.  -----------------------------------  [name], MD   This document serves as a record of services personally performed by [name], MD. It was created on her behalf by Steva Colder, a trained medical scribe. The creation of this record is based on the scribe's personal observations and the provider's statements to them. This document has been checked and approved by the attending provider.

## 2018-01-23 ENCOUNTER — Encounter: Payer: Self-pay | Admitting: Radiation Oncology

## 2018-01-23 ENCOUNTER — Ambulatory Visit
Admission: RE | Admit: 2018-01-23 | Discharge: 2018-01-23 | Disposition: A | Discharge status: Home / Self Care | Payer: Medicare Other | Source: Ambulatory Visit | Attending: Radiation Oncology | Admitting: Radiation Oncology

## 2018-01-23 ENCOUNTER — Other Ambulatory Visit: Payer: Self-pay

## 2018-01-23 ENCOUNTER — Inpatient Hospital Stay: Admission: RE | Admit: 2018-01-23 | Payer: Self-pay | Source: Ambulatory Visit | Admitting: Radiation Oncology

## 2018-01-23 VITALS — BP 128/45 | HR 52 | Temp 98.4°F | Resp 18

## 2018-01-23 DIAGNOSIS — Z923 Personal history of irradiation: Secondary | ICD-10-CM | POA: Insufficient documentation

## 2018-01-23 DIAGNOSIS — Z79899 Other long term (current) drug therapy: Secondary | ICD-10-CM | POA: Diagnosis not present

## 2018-01-23 DIAGNOSIS — R229 Localized swelling, mass and lump, unspecified: Secondary | ICD-10-CM | POA: Diagnosis not present

## 2018-01-23 DIAGNOSIS — F0390 Unspecified dementia without behavioral disturbance: Secondary | ICD-10-CM | POA: Diagnosis not present

## 2018-01-23 DIAGNOSIS — C770 Secondary and unspecified malignant neoplasm of lymph nodes of head, face and neck: Secondary | ICD-10-CM | POA: Diagnosis not present

## 2018-01-23 DIAGNOSIS — C760 Malignant neoplasm of head, face and neck: Secondary | ICD-10-CM | POA: Diagnosis not present

## 2018-01-23 NOTE — Progress Notes (Signed)
Radiation Oncology         (336) [phone] ________________________________  Name: Melvin Kent MRN: [id number]  Date: 01/23/2018  DOB: 08-25-1930  Follow-Up Visit Note  CC: [facility]  Helayne Seminole, MD  Diagnosis and Prior Radiotherapy:       ICD-10-CM   1. 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  He is accompanied by his wife and caregiver.  Pain issues, if any: He denies.  Weight changes, if any: His family and caregivers deny recent weight loss.   Swallowing issues, if any: He denies.   Smoking or chewing tobacco? No  Other notable issues, if any: He has a wound where he has received radiation to the right parotid mass that is draining a thick yellow drainage. His group home is putting acquacel over this area. He continues to have a wound to his posterior right ear. His caregiver is applying gentamicin.                  ALLERGIES:  is allergic to morphine and related and penicillins.  Meds: Current Outpatient Medications  Medication Sig Dispense Refill  . acetaminophen (TYLENOL) 500 MG tablet Take 500 mg by mouth every 6 (six) hours as needed for mild pain or moderate pain.    . bacitracin  ointment Apply 1 application topically 2 (two) times daily. Apply a small amount to clean minor cuts or abrasions, use one centimeter 3 times a day, not to exceed 3 applications per 24 hours.    . carvedilol (COREG) 6.25 MG tablet Take 6.25 mg by mouth 2 (two) times daily with a meal.    . cetirizine (ZYRTEC) 5 MG tablet Take 5 mg by mouth daily.    . Cholecalciferol (VITAMIN D3) 1000 units CAPS Take 1 capsule by mouth daily.    . collagenase (SANTYL) ointment Apply 1 application topically daily. Apply to Left heel three times a week per wound care    . diazepam (VALIUM) 5 MG tablet Take 5 mg by mouth 2 (two) times daily. Take 1/2 tablet by mouth twice daily    . Emollient (CERAVE) CREA Apply topically.    Marland Kitchen escitalopram (LEXAPRO) 20 MG tablet Take 20 mg by mouth daily.    . feeding supplement (BOOST HIGH PROTEIN) LIQD Take 1 Container by mouth 3 (three) times daily between meals.    . ferrous sulfate (FEROSUL) 325 (65 FE) MG tablet Take 325 mg by mouth daily with breakfast.    . furosemide (LASIX) 20 MG tablet Take one pill as needed for swelling    . glucose blood ([employer] BLOOD GLUCOSE TEST STRIPS) test strip 1 each by Other route every morning. Freestyle lite test strips.    . haloperidol (HALDOL) 1  MG tablet Take 1 mg by mouth every 8 (eight) hours as needed for agitation. Take one tablet by mouth 3 times daily as needed for agitation    . hydrALAZINE (APRESOLINE) 25 MG tablet Take 50 mg by mouth 3 (three) times daily. Take 50 mg by mouth three times daily.    . hydrOXYzine (ATARAX/VISTARIL) 10 MG tablet Take 10 mg by mouth every 12 (twelve) hours as needed for anxiety (agitation).    Marland Kitchen ibuprofen (ADVIL,MOTRIN) 800 MG tablet Take 800 mg by mouth every 8 (eight) hours as needed. Take 1 tablet by mouth 1 hour prior to wound care appointments. On Tuesday and Friday, wound care nurse will call night prior to advise of appointment time.    Marland Kitchen ipratropium (ATROVENT) 0.03 % nasal spray     . ketoconazole  (NIZORAL) 2 % cream Apply 1 application topically 2 (two) times daily. Apply topically to affected area on genital rash twice daily for 14 days until 06/19/17    . ketotifen (ZADITOR) 0.025 % ophthalmic solution Place 1 drop into both eyes 2 (two) times daily as needed (for itching).    . Lactobacillus Acid-Pectin (ACIDOPHILUS/PECTIN) CAPS Take by mouth.    . lamoTRIgine (LAMICTAL) 100 MG tablet Take 100 mg by mouth daily.    Marland Kitchen loperamide (IMODIUM) 2 MG capsule Take 2 mg by mouth as needed for diarrhea or loose stools. Take one caplet by mouth as needed for diarrhea (not to exceed 4 tablets in 24 hour period)    . memantine (NAMENDA) 10 MG tablet Take 10 mg by mouth daily.    . Menthol-Methyl Salicylate (MUSCLE RUB) 10-15 % CREA Apply 1 application topically as needed for muscle pain. Use Ephraim Hamburger. Apply liberally and massage sore muscle areas gently as needed for muscle ache. May repeat every 6 hours as needed, not to exceed 4 times per 24 hours.    . Multiple Vitamins-Minerals (PRESERVISION AREDS 2 PO) Take 1 capsule by mouth 2 (two) times daily. With breakfast and dinner    . naproxen (NAPROSYN) 500 MG tablet Take 500 mg by mouth 2 (two) times daily with a meal.    . nystatin (NYSTATIN) powder Apply topically 2 (two) times daily. Apply topically to affected areas on groin and buttocks twice daily for 4 days as needed for rash and redness    . OLANZapine (ZYPREXA) 5 MG tablet Take 5 mg by mouth daily. Dissolve 1/2 tablet (2.5 mg) on the tongue in the morning, and dissolve 6 mg tablet on the tongue at supper,    . ondansetron (ZOFRAN) 4 MG tablet Take 4 mg by mouth every 8 (eight) hours as needed for nausea or vomiting.    . polycarbophil (FIBERCON) 625 MG tablet Take 625 mg by mouth daily.    . polyethylene glycol (MIRALAX / GLYCOLAX) packet Take 17 g by mouth daily as needed.    Marland Kitchen PRESCRIPTION MEDICATION Inject 1 each into the skin once a week. Allergy shot. --Mondays--    . ranitidine (ZANTAC) 300  MG tablet Take 300 mg by mouth at bedtime.    . ReliOn Ultra Thin Lancets MISC 1 each every morning. FreeStyle Lancets    . saxagliptin HCl (ONGLYZA) 2.5 MG TABS tablet Take 2.5 mg by mouth daily.    . sodium chloride (OCEAN) 0.65 % SOLN nasal spray Place 1-2 sprays into both nostrils daily as needed for congestion.    . triamcinolone cream (KENALOG) 0.1 % Apply 1 application topically once. Apply once daily (  mixed with ketroconazole)    . carvedilol (COREG) 6.25 MG tablet Take 6.25 mg by mouth 2 (two) times daily.     . clindamycin (CLEOCIN) 300 MG capsule Take 300 mg by mouth 2 (two) times daily. Start 11/19/17 finish 12/01/17    . donepezil (ARICEPT) 10 MG tablet Take 10 mg by mouth daily.     No current facility-administered medications for this encounter.     Physical Findings: Wt Readings from Last 3 Encounters:  06/06/17 178 lb 9.6 oz (81 kg)  09/04/15 189 lb (85.7 kg)  04/21/15 198 lb 11.2 oz (90.1 kg)    temperature is 98.4 F (36.9 C). His blood pressure is 128/45 (abnormal) and his pulse is 52 (abnormal). His respiration is 18 and oxygen saturation is 100%.  General: Alert and oriented, in no acute distress. Neck: No palpable masses appreciated in the left neck. In the right neck he has a crater-like ulcer behind the right ear that is persistent. He has a protuberant mass that remains at the angle of his right mandible which is now eroding through the skin. There's some milky white fluid draining from the skin. Skin: He has a papular mass over the upper left cheek which looks suspicious for basal cell carcinoma. There is a crusted scab that is raised over the temporal scalp on the left.   Lab Findings: Lab Results  Component Value Date   WBC 7.3 07/25/2014   HGB 12.8 (L) 07/25/2014   HCT 39.8 07/25/2014   MCV 87.7 07/25/2014   PLT 206 07/25/2014    Lab Results  Component Value Date   TSH 2.500 08/10/2014    Radiographic Findings: No results found.  Impression/Plan:     1) Head and Neck Cancer Status: While he had a drastic response to his first round of RT to the post auricular mass, the right parotid mass did not respond very much to the shorter course of RT.  Luckily, however, he does not complain of symptoms related to his neck masses. We discussed the option of further radiation therapy to the remaining mass versus comfort care alone. The patient's wife declined further radiation at this time. The patient, due to dementia, did not voice an opinion.  I think comfort case IS the best option.  2) Nutritional Status: No issues.   3) Skin Nodules: Encouraged patient to call Dr. Nevada Crane if they begin to cause symptoms... . We can radiate symptomatic cancers in future if needed.  4) Follow-up prn. The patient's wife and caregiver were encouraged to call if he they like to pursue further radiation treatment.  I wrote a summary of our visit on paper for his care team.  I spent 20 minutes face to face with the patient and more than 50% of that time was spent in counseling and/or coordination of care. _____________________________________   [name], MD  This document serves as a record of services personally performed by [name], MD. It was created on her behalf by Rae Lips, a trained medical scribe. The creation of this record is based on the scribe's personal observations and the provider's statements to them. This document has been checked and approved by the attending provider.

## 2018-01-23 NOTE — Progress Notes (Signed)
Melvin Kent presents for follow up of radiation completed 12/24/17 to his right parotid.  Pain issues, if any: He denies Using a feeding tube?: N/A Weight changes, if any: His family and caregivers deny recent weight loss.  Swallowing issues, if any: He denies.  Smoking or chewing tobacco? No Using fluoride trays daily? N/A Last ENT visit was on: no Other notable issues, if any:  He has a wound where he has received radiation in the past. It is draining a thick yellow drainage. His group home is putting acquacel over this area.  He continues to have a wound to his posterior Right ear.  BP (!) 128/45   Pulse (!) 52   Temp 98.4 F (36.9 C)   Resp 18   SpO2 100% Comment: room air

## 2018-01-24 ENCOUNTER — Encounter: Payer: Self-pay | Admitting: Radiation Oncology

## 2018-01-30 ENCOUNTER — Ambulatory Visit: Admission: RE | Admit: 2018-01-30 | Payer: Medicare Other | Source: Ambulatory Visit | Admitting: Radiation Oncology

## 2018-03-18 ENCOUNTER — Encounter: Payer: Self-pay | Admitting: *Deleted

## 2018-03-23 DEATH — deceased

## 2018-06-28 IMAGING — CT CT CHEST W/ CM
4 of 8 series · 16 of 36 positions shown, 17 images · IV contrast (isovue)
Comparison: Chest CT 06/21/2008

CLINICAL DATA: Head neck carcinoma..  Post radiation treatment.

EXAM:
CT CHEST WITH CONTRAST
TECHNIQUE: Multidetector CT imaging of the chest was performed during
intravenous contrast administration.
CONTRAST:  75 cc Isovue

[Series 5: axial st · axial · 0.77mm/px · z∈[+1300,+1394]mm · 2 of 58 slices shown, 3 images]
[im 20/58  mediastinal]
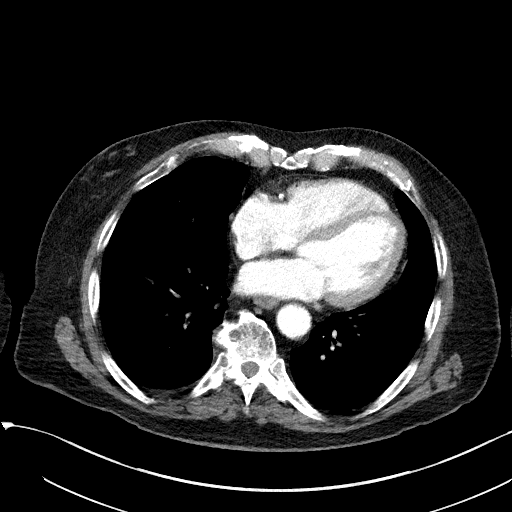
[im 20/58  lung]
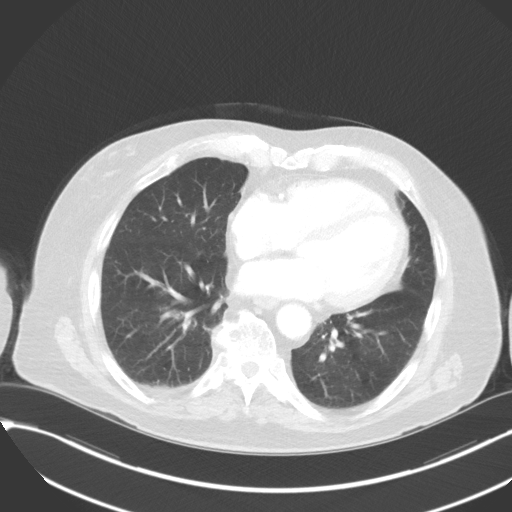
[im 39/58  lung]
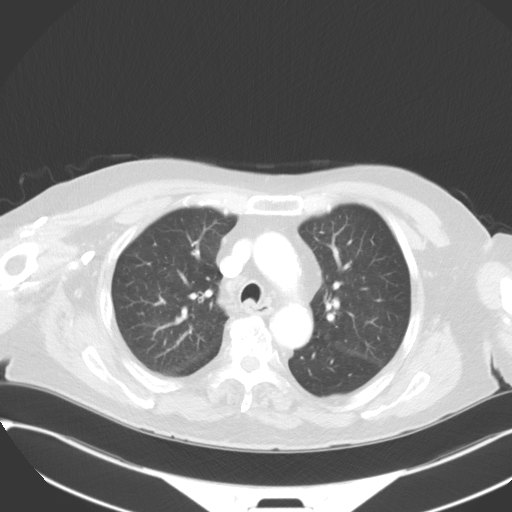

[Series 9: axial neck · axial · 0.46mm/px · z∈[+1443,+1539]mm · 5 of 98 slices shown]
[im 13/98  lung]
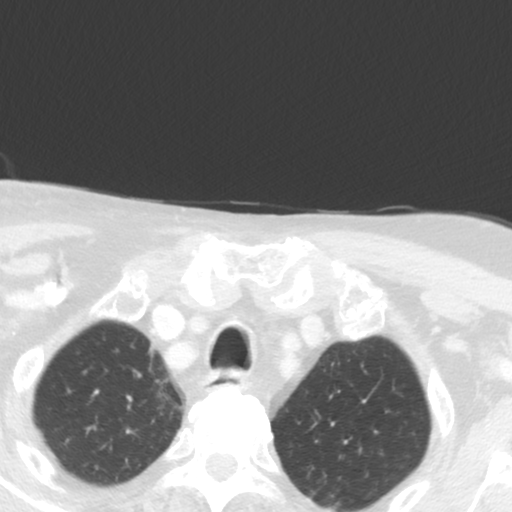
[im 25/98  lung]
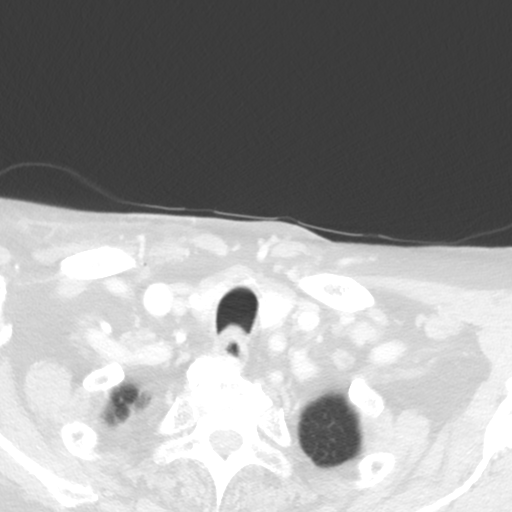
[im 37/98  lung]
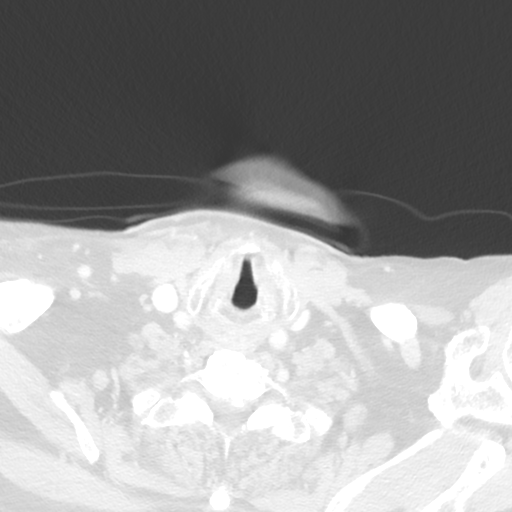
[im 49/98  lung]
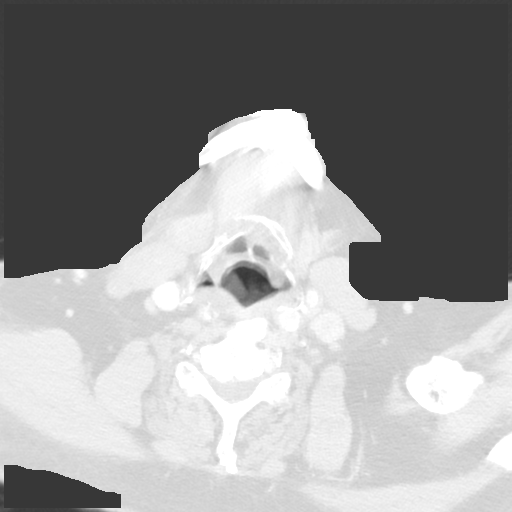
[im 61/98  lung]
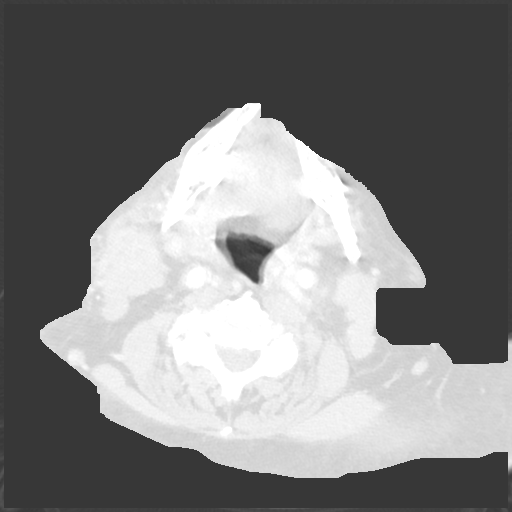

[Series 10: orthogonal ax · axial · 0.39mm/px · z∈[+1397,+1588]mm · 8 of 123 slices shown]
[im 13/123  lung]
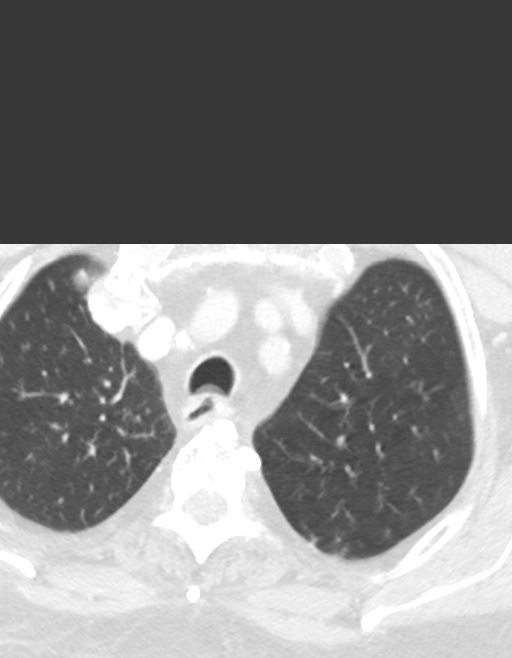
[im 25/123  lung]
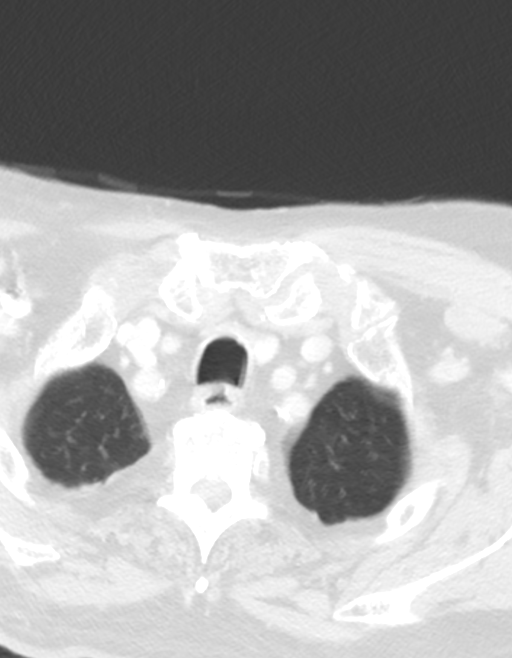
[im 37/123  lung]
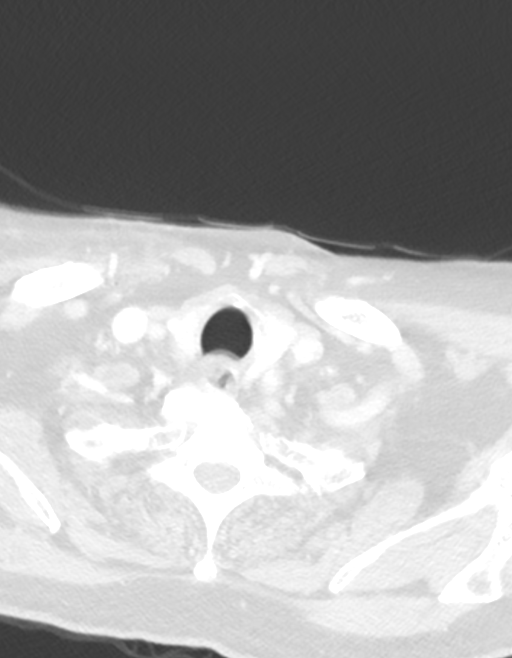
[im 49/123  lung]
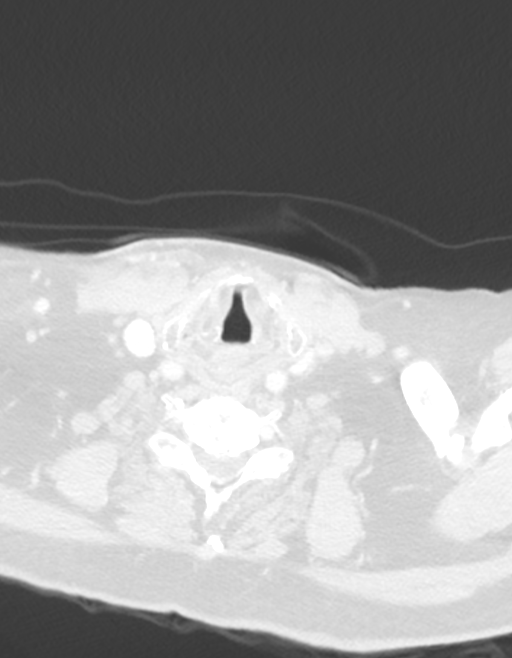
[im 74/123  lung]
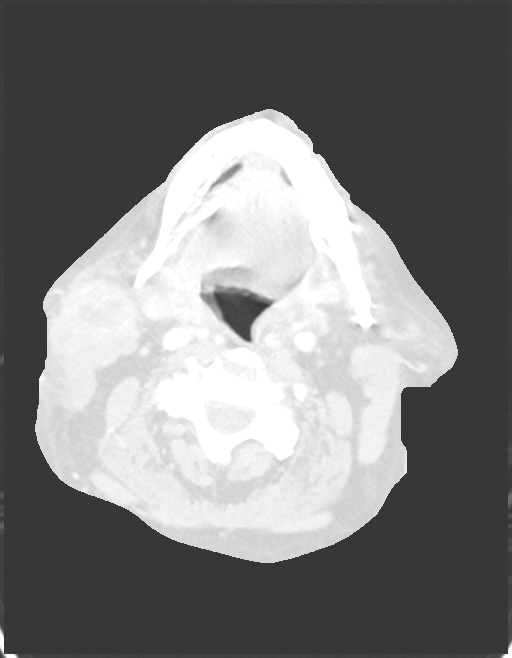
[im 86/123  lung]
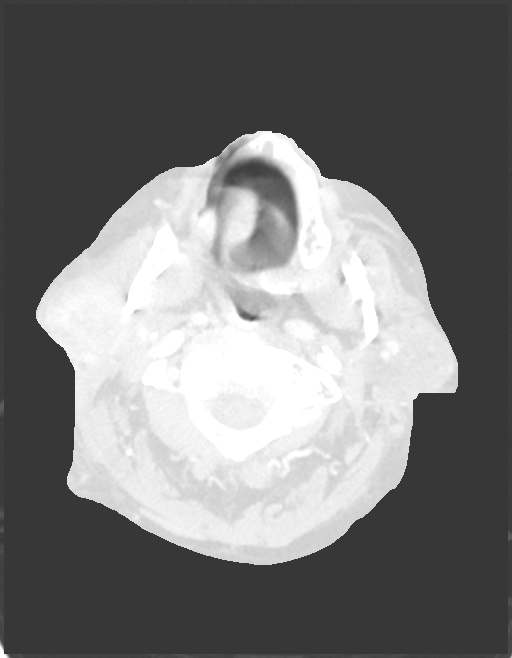
[im 98/123  lung]
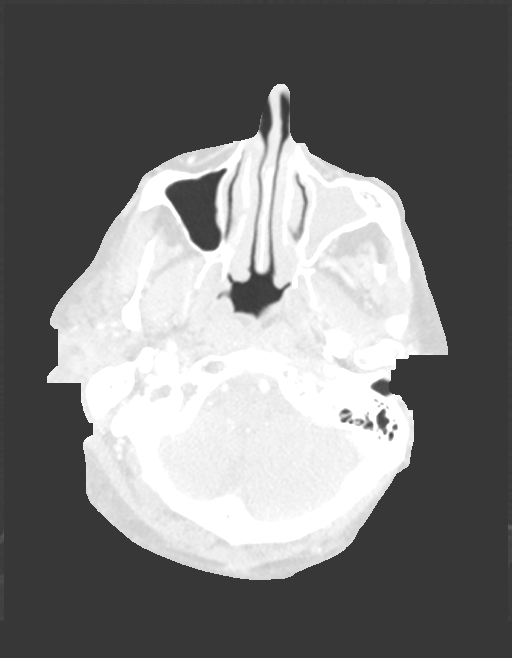
[im 110/123  lung]
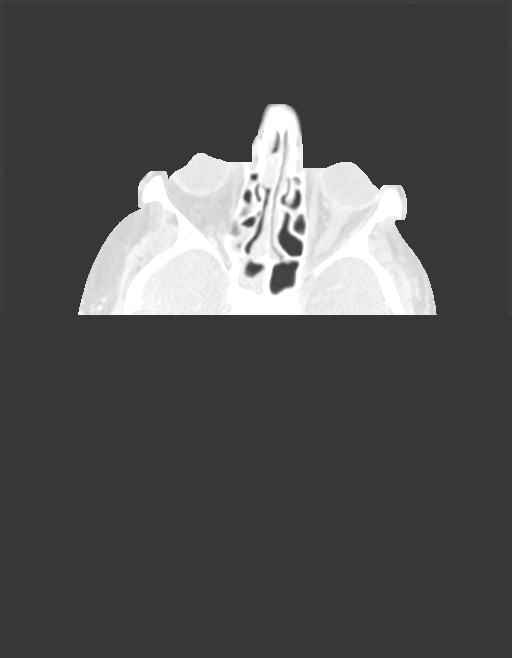

[Series 11: cor neck · coronal · 0.39mm/px · 1 of 126 slices shown]
[im 63/126  lung]
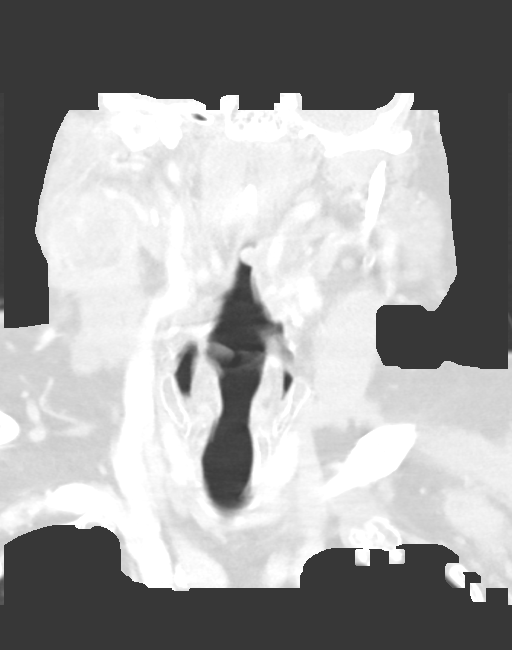

[16 of 36 positions shown; findings below may reference images not displayed]

FINDINGS: Cardiovascular: Coronary artery calcification and aortic
atherosclerotic calcification.

Mediastinum/Nodes: No axillary supraclavicular adenopathy. No
mediastinal. There is large calcified subcarinal lymph node
measuring 2.5 cm which is not changed.

Lungs/Pleura: No suspicious pulmonary nodules.

Upper Abdomen: Limited view of the liver, kidneys, pancreas are
unremarkable. Normal adrenal glands. Calcified granulomata within
the liver and spleen.

Musculoskeletal: No aggressive osseous lesion. Degenerative
osteophytosis of the spine.
IMPRESSION: 1. No evidence of malignancy in the thorax.
2. Evidence of granulomatous disease with calcified subcarinal lymph
node and granulomata within the spleen and liver.

See separate report for CT neck.

Aortic Atherosclerosis ([id number].O).

## 2020-07-10 ENCOUNTER — Telehealth: Payer: Self-pay
